# Patient Record
Sex: Male | Born: 1935 | Race: White | Hispanic: No | Smoking: Former smoker
Health system: Southern US, Community
[De-identification: ages and names within clinical notes are randomized; demographics above are authoritative.]

## PROBLEM LIST (undated history)

## (undated) DIAGNOSIS — K219 Gastro-esophageal reflux disease without esophagitis: Secondary | ICD-10-CM

## (undated) DIAGNOSIS — E785 Hyperlipidemia, unspecified: Secondary | ICD-10-CM

## (undated) DIAGNOSIS — R011 Cardiac murmur, unspecified: Secondary | ICD-10-CM

## (undated) DIAGNOSIS — Z889 Allergy status to unspecified drugs, medicaments and biological substances status: Secondary | ICD-10-CM

## (undated) DIAGNOSIS — F419 Anxiety disorder, unspecified: Secondary | ICD-10-CM

## (undated) DIAGNOSIS — R42 Dizziness and giddiness: Secondary | ICD-10-CM

## (undated) DIAGNOSIS — I8390 Asymptomatic varicose veins of unspecified lower extremity: Secondary | ICD-10-CM

## (undated) DIAGNOSIS — J45909 Unspecified asthma, uncomplicated: Secondary | ICD-10-CM

## (undated) DIAGNOSIS — I714 Abdominal aortic aneurysm, without rupture, unspecified: Secondary | ICD-10-CM

## (undated) DIAGNOSIS — R29898 Other symptoms and signs involving the musculoskeletal system: Secondary | ICD-10-CM

## (undated) DIAGNOSIS — M1990 Unspecified osteoarthritis, unspecified site: Secondary | ICD-10-CM

## (undated) DIAGNOSIS — R739 Hyperglycemia, unspecified: Secondary | ICD-10-CM

## (undated) DIAGNOSIS — L853 Xerosis cutis: Secondary | ICD-10-CM

## (undated) HISTORY — PX: NASAL SEPTUM SURGERY: SHX37

## (undated) HISTORY — DX: Other symptoms and signs involving the musculoskeletal system: R29.898

## (undated) HISTORY — DX: Hyperlipidemia, unspecified: E78.5

## (undated) HISTORY — DX: Hyperglycemia, unspecified: R73.9

## (undated) HISTORY — PX: CATARACT EXTRACTION W/ INTRAOCULAR LENS  IMPLANT, BILATERAL: SHX1307

## (undated) HISTORY — DX: Anxiety disorder, unspecified: F41.9

## (undated) HISTORY — PX: INGUINAL HERNIA REPAIR: SUR1180

## (undated) HISTORY — DX: Gastro-esophageal reflux disease without esophagitis: K21.9

## (undated) HISTORY — DX: Abdominal aortic aneurysm, without rupture, unspecified: I71.40

## (undated) HISTORY — DX: Asymptomatic varicose veins of unspecified lower extremity: I83.90

## (undated) HISTORY — PX: VARICOSE VEIN SURGERY: SHX832

## (undated) HISTORY — DX: Dizziness and giddiness: R42

## (undated) HISTORY — DX: Unspecified osteoarthritis, unspecified site: M19.90

## (undated) HISTORY — PX: COLONOSCOPY: SHX174

## (undated) HISTORY — DX: Abdominal aortic aneurysm, without rupture: I71.4

---

## 2013-02-20 DIAGNOSIS — R42 Dizziness and giddiness: Secondary | ICD-10-CM

## 2014-03-25 ENCOUNTER — Encounter: Payer: Self-pay | Admitting: Vascular Surgery

## 2014-04-14 ENCOUNTER — Encounter: Payer: Medicare Other | Admitting: Vascular Surgery

## 2015-09-29 ENCOUNTER — Encounter: Payer: Self-pay | Admitting: Vascular Surgery

## 2015-10-01 ENCOUNTER — Encounter: Payer: Self-pay | Admitting: Vascular Surgery

## 2015-10-06 ENCOUNTER — Ambulatory Visit (INDEPENDENT_AMBULATORY_CARE_PROVIDER_SITE_OTHER): Payer: Medicare Other | Admitting: Vascular Surgery

## 2015-10-06 ENCOUNTER — Telehealth: Payer: Self-pay | Admitting: Vascular Surgery

## 2015-10-06 ENCOUNTER — Other Ambulatory Visit: Payer: Self-pay | Admitting: Vascular Surgery

## 2015-10-06 ENCOUNTER — Encounter: Payer: Self-pay | Admitting: Vascular Surgery

## 2015-10-06 VITALS — BP 141/82 | HR 58 | Temp 97.1°F | Resp 16 | Ht 75.0 in | Wt 168.0 lb

## 2015-10-06 DIAGNOSIS — I714 Abdominal aortic aneurysm, without rupture, unspecified: Secondary | ICD-10-CM | POA: Insufficient documentation

## 2015-10-06 LAB — CREATININE, SERUM: Creat: 1.01 mg/dL (ref 0.70–1.11)

## 2015-10-06 NOTE — Progress Notes (Signed)
[facility] [place] HISTORY AND PHYSICAL   Referring Physician: Kirstie Peri, MD History of Present Illness:  Patient is a 82 y.o. male who presents for evaluation of  Abdominal aortic aneurysm. The patient has had a known aneurysm for 10-12 years. It has progressively slowly enlarged. Most recent ultrasound showed it was between 5 and 6 cm. The patient denies any abdominal or back pain. He has a family history of abdominal aortic aneurysm in his father who died during an operation for an aneurysm. His sister also had an abdominal aortic aneurysm but died from other causes. He is a former smoker but quit over 40 years ago.   The patient overall is very active. He walks 3 miles daily. He also does 30 minutes of weight training including body weight squats for 30 minutes 5 days per week. 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 Other medical problems include hyperlipidemia which is currently stable. He is not on aspirin due to previous history of nosebleeds with nasal polyps. He denies shortness of breath with exertion. He denies chest pain. He has no prior history of stroke or myocardial infarction.  Past Medical History  Diagnosis Date  . AAA (abdominal aortic aneurysm) (HCC)   . GERD (gastroesophageal reflux disease)   . Osteoarthritis   . Anxiety   . Varicose veins   . Hyperglycemia   . Hyperlipidemia   . Tinea corporis   . Vertigo     Past Surgical History  Procedure Laterality Date  . Varicose vein surgery    . Nasal sinus surgery    . Hernia repair Bilateral    left leg vein stripping ever weeks ago.  Social History Social History  Substance Use Topics  . Smoking status: Former Smoker    Types: Cigarettes    Quit date: 10/05/1968  . Smokeless tobacco: Never Used  . Alcohol Use: 0.0 oz/week    0 Standard drinks or equivalent per week    Family History Family History  Problem Relation Age of Onset  . AAA (abdominal aortic aneurysm) Father      Allergies  Allergies  Allergen Reactions  . Asa [Aspirin]     Avoids with hx of nasal polyps  . Coffee Flavor     Nocturnal jerking      Current Outpatient Prescriptions  Medication Sig Dispense Refill  . acetaminophen (TYLENOL) 650 MG CR tablet Take 650 mg by mouth every 8 (eight) hours as needed for pain.    . chlordiazePOXIDE (LIBRIUM) 5 MG capsule Take 5 mg by mouth at bedtime.    . clobetasol cream (TEMOVATE) 0.05 % Apply 1 application topically 2 (two) times daily.    . fluticasone (FLONASE) 50 MCG/ACT nasal spray Place 2 sprays into both nostrils daily.    . fluticasone (VERAMYST) 27.5 MCG/SPRAY nasal spray Place 2 sprays into the nose daily.    . metoprolol succinate (TOPROL-XL) 25 MG 24 hr tablet Take 25 mg by mouth daily.    Marland Kitchen omeprazole (PRILOSEC) 20 MG capsule Take 20 mg by mouth daily. Reported on 10/06/2015    . Probiotic Product (PROBIOTIC DAILY PO) Take 1 tablet by mouth daily. Reported on 10/06/2015     No current facility-administered medications for this visit.    ROS:   General:  No weight loss, Fever, chills  HEENT: No recent headaches, no nasal bleeding, no visual changes, no sore throat  Neurologic: No dizziness, blackouts, seizures. No recent symptoms of stroke or mini- stroke. No recent  episodes of slurred speech, or temporary blindness.  Cardiac: No recent episodes of chest pain/pressure, no shortness of breath at rest.  No shortness of breath with exertion.  Denies history of atrial fibrillation or irregular heartbeat  Vascular: No history of rest pain in feet.  No history of claudication.  No history of non-healing ulcer, No history of DVT   Pulmonary: No home oxygen, no productive cough, no hemoptysis,  No asthma or wheezing  Musculoskeletal:   Arthritis,  Low back pain,   Joint pain  Hematologic:No history of hypercoagulable state.  No history of easy bleeding.  No history of anemia  Gastrointestinal: No hematochezia or melena,  No  gastroesophageal reflux, no trouble swallowing  Urinary:  chronic Kidney disease,  on HD -  MWF or  TTHS,  Burning with urination,  Frequent urination,  Difficulty urinating;   Skin: No rashes  Psychological: No history of anxiety,  No history of depression   Physical Examination  Filed Vitals:   10/06/15 1317 10/06/15 1319 10/06/15 1326  BP: 160/88 160/90 141/82  Pulse: 58 58 58  Temp: 97.1 F (36.2 C)    Resp: 16    Height:  (1.905 m)    Weight: 168 lb (76.204 kg)    SpO2: 95%      Body mass index is 21 kg/(m^2).  General:  Alert and oriented, no acute distress HEENT: Normal Neck: No bruit or JVD Pulmonary: Clear to auscultation bilaterally Cardiac: Regular Rate and Rhythm without murmur Abdomen: Soft, non-tender, non-distended,  Pulsatile mass in the epigastrium nontender Skin: No rash Extremity Pulses:  2+ radial, brachial,  3+ right femoral widened pulsation consistent with right common femoral aneurysm,  2-3+ popliteal pulses bilaterally also widened,  2+dorsalis pedis, posterior tibial pulses bilaterally Musculoskeletal: No deformity or edema  Neurologic: Upper and lower extremity motor 5/5 and symmetric  DATA:   Ultrasound report dated 09/20/2015 from [facility] radiology is reviewed. This showed 6 x 5 cm abdominal aortic aneurysm with ectatic common iliac arteries.   ASSESSMENT:   Asymptomatic 6 cm abdominal aortic aneurysm. 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 He may also have popliteal aneurysms.   PLAN:   Due to the patient's family history of aneurysms and clinical exam consistent with peripheral aneurysms as well we will obtain a CT angiogram of the chest abdomen and pelvis to make sure that he does not have a thoracic aneurysm in addition to his abdominal aortic aneurysm. This will also allow planning for possible stent grafting of his abdominal aortic aneurysm. We will also schedule the  patient for cardiology evaluation for risk stratification. He'll return for follow-up with me after his cardiology evaluation and CT scan. If he is a candidate for repair he would prefer to have his aneurysm fixed between the week of March 13 the 17th while his daughter is off from work.  Fabienne Bruns, MD [facility] [place] Office: [phone] Pager: [phone]

## 2015-10-06 NOTE — Progress Notes (Signed)
Filed Vitals:   10/06/15 1317 10/06/15 1319 10/06/15 1326  BP: 160/88 160/90 141/82  Pulse: 58 58 58  Temp: 97.1 F (36.2 C)    Resp: 16    Height:  (1.905 m)    Weight: 168 lb (76.204 kg)    SpO2: 95%

## 2015-10-06 NOTE — Addendum Note (Signed)
Addended by: Adria Dill L on: 10/06/2015 02:38 PM   Modules accepted: Orders

## 2015-10-06 NOTE — Telephone Encounter (Signed)
Spoke with pt - gave him the following info which he wrote down - cardiology appointment with Dr. Darl Householder 10/11/15 8:45 am [phone]. CT's will be done at [facility] on 10/11/15 at 3:45pm. Patient already did his labs at [facility] today. He was told no solid foods 4 hrs prior. Then follow up with Dr. Darrick Penna on 10/28/15 at 3:30 pm. Patient verbalized understanding.

## 2015-10-08 ENCOUNTER — Telehealth: Payer: Self-pay | Admitting: *Deleted

## 2015-10-08 ENCOUNTER — Encounter: Payer: Self-pay | Admitting: *Deleted

## 2015-10-08 NOTE — Telephone Encounter (Signed)
This patient needs the CTA abdomen and pelvis with runoff because of popliteal fullness (R29.898) to rule out popliteal aneurysms. He has known thoracoabdominal aneurysm and common iliac aneurysms. These scans are for preop evaluation. Patient has strong family history of aneurysms.

## 2015-10-11 ENCOUNTER — Ambulatory Visit (HOSPITAL_COMMUNITY)
Admission: RE | Admit: 2015-10-11 | Discharge: 2015-10-11 | Disposition: A | Discharge status: Home / Self Care | Payer: Medicare Other | Source: Ambulatory Visit | Attending: Vascular Surgery | Admitting: Vascular Surgery

## 2015-10-11 ENCOUNTER — Ambulatory Visit (INDEPENDENT_AMBULATORY_CARE_PROVIDER_SITE_OTHER): Payer: Medicare Other | Admitting: Cardiovascular Disease

## 2015-10-11 ENCOUNTER — Encounter: Payer: Self-pay | Admitting: Cardiovascular Disease

## 2015-10-11 VITALS — BP 108/78 | HR 64 | Ht 75.0 in | Wt 169.0 lb

## 2015-10-11 DIAGNOSIS — I724 Aneurysm of artery of lower extremity: Secondary | ICD-10-CM | POA: Insufficient documentation

## 2015-10-11 DIAGNOSIS — I714 Abdominal aortic aneurysm, without rupture, unspecified: Secondary | ICD-10-CM

## 2015-10-11 DIAGNOSIS — I723 Aneurysm of iliac artery: Secondary | ICD-10-CM | POA: Insufficient documentation

## 2015-10-11 DIAGNOSIS — R918 Other nonspecific abnormal finding of lung field: Secondary | ICD-10-CM | POA: Insufficient documentation

## 2015-10-11 DIAGNOSIS — Z136 Encounter for screening for cardiovascular disorders: Secondary | ICD-10-CM

## 2015-10-11 DIAGNOSIS — R011 Cardiac murmur, unspecified: Secondary | ICD-10-CM

## 2015-10-11 DIAGNOSIS — Z01818 Encounter for other preprocedural examination: Secondary | ICD-10-CM | POA: Diagnosis not present

## 2015-10-11 MED ORDER — IOHEXOL 350 MG/ML SOLN
150.0000 mL | Freq: Once | INTRAVENOUS | Status: AC | PRN
  Administered 2015-10-11: 150 mL via INTRAVENOUS

## 2015-10-11 NOTE — Patient Instructions (Signed)
Your physician has requested that you have an echocardiogram. Echocardiography is a painless test that uses sound waves to create images of your heart. It provides your doctor with information about the size and shape of your heart and how well your heart's chambers and valves are working. This procedure takes approximately one hour. There are no restrictions for this procedure. Office will contact with results via phone or letter.   Continue all current medications. Follow up based on test results  

## 2015-10-11 NOTE — Progress Notes (Signed)
Patient ID: Devin Shaw, male   DOB: Nov 30, 1923, 82 y.o.   MRN: [id number]       CARDIOLOGY CONSULT NOTE  Patient ID: Devin Shaw MRN: [id number] DOB/AGE: 10/12/1923 82 y.o.  Admit date: (Not on file) Primary Physician Kirstie Peri, MD  Reason for Consultation: AAA, pre-op clearance  HPI: 82 yr old male presents for preoperative risk stratification. Has AAA and evaluated by Dr. Darrick Penna on 10/06/15 who plans to perform stent grafting in March. Quit smoking over 40 yrs ago. Denies chest pain and exertional dyspnea. Stays active and walks 3 miles daily and does strength/weight training with barbells 30 minutes five days per week. Denies a h/o MI and CVA.  Has some issues with dizziness due to "inner ear problems" and occasionally balance, but denies syncope.  ECG today shows sinus rhythm with LVH and nonspecific T wave abnormalities.    Allergies  Allergen Reactions  . Asa [Aspirin]     Avoids with hx of nasal polyps  . Coffee Flavor     Nocturnal jerking     Current Outpatient Prescriptions  Medication Sig Dispense Refill  . acetaminophen (TYLENOL) 650 MG CR tablet Take 650 mg by mouth every 8 (eight) hours as needed for pain.    . cetirizine (ZYRTEC) 10 MG tablet Take 10 mg by mouth daily.    . chlordiazePOXIDE (LIBRIUM) 5 MG capsule Take 5 mg by mouth at bedtime.    . fluticasone (FLONASE) 50 MCG/ACT nasal spray Place 2 sprays into both nostrils daily.    . metoprolol succinate (TOPROL-XL) 25 MG 24 hr tablet Take 12.5 mg by mouth daily.      No current facility-administered medications for this visit.    Past Medical History  Diagnosis Date  . AAA (abdominal aortic aneurysm) (HCC)   . GERD (gastroesophageal reflux disease)   . Osteoarthritis   . Anxiety   . Varicose veins   . Hyperglycemia   . Hyperlipidemia   . Tinea corporis   . Vertigo   . Popliteal fullness     Past Surgical History  Procedure Laterality Date  . Varicose vein surgery    . Nasal sinus surgery     . Hernia repair Bilateral     Social History   Social History  . Marital Status: Married    Spouse Name: N/A  . Number of Children: N/A  . Years of Education: N/A   Occupational History  . Not on file.   Social History Main Topics  . Smoking status: Former Smoker    Types: Cigarettes    Quit date: 10/05/1968  . Smokeless tobacco: Never Used  . Alcohol Use: 0.0 oz/week    0 Standard drinks or equivalent per week  . Drug Use: No  . Sexual Activity: Not on file   Other Topics Concern  . Not on file   Social History Narrative     No family history of premature CAD in 1st degree relatives.  Prior to Admission medications   Medication Sig Start Date End Date Taking? Authorizing Provider  acetaminophen (TYLENOL) 650 MG CR tablet Take 650 mg by mouth every 8 (eight) hours as needed for pain.   Yes Historical Provider, MD  cetirizine (ZYRTEC) 10 MG tablet Take 10 mg by mouth daily.   Yes Historical Provider, MD  chlordiazePOXIDE (LIBRIUM) 5 MG capsule Take 5 mg by mouth at bedtime.   Yes Historical Provider, MD  fluticasone (FLONASE) 50 MCG/ACT nasal spray Place 2 sprays into both nostrils  daily.   Yes Historical Provider, MD  metoprolol succinate (TOPROL-XL) 25 MG 24 hr tablet Take 12.5 mg by mouth daily.    Yes Historical Provider, MD     Review of systems complete and found to be negative unless listed above in HPI     Physical exam Blood pressure 108/78, pulse 64, height  (1.905 m), weight 169 lb (76.658 kg), SpO2 98 %. General: NAD Neck: No JVD, no thyromegaly or thyroid nodule.  Lungs: Clear to auscultation bilaterally with normal respiratory effort. CV: Nondisplaced PMI. Regular rate and rhythm, normal S1/S2, no S3/S4, 2/6 apical holosystolic murmur.  No peripheral edema.  No carotid bruit.   Abdomen: Soft, nontender no distention.  Skin: Intact without lesions or rashes.  Neurologic: Alert and oriented x 3.  Psych: Normal affect. Extremities: No  clubbing or cyanosis.  HEENT: Normal.   ECG: Most recent ECG reviewed.  Labs:  No results found for: WBC, HGB, HCT, MCV, PLT  Recent Labs Lab 10/06/15 1520  CREATININE 1.01   No results found for: CKTOTAL, CKMB, CKMBINDEX, TROPONINI No results found for: CHOL No results found for: HDL No results found for: LDLCALC No results found for: TRIG No results found for: CHOLHDL No results found for: LDLDIRECT       Studies: No results found.  ASSESSMENT AND PLAN:  1. Preoperative risk stratification: He has no h/o MI or CVA. He is able to walk a 20 minute mile which is roughly 3.5 METS. I feel he is at an acceptable risk for AAA stent grafting and does not warrant stress testing. He has an apical murmur for which I will obtain an echocardiogram, and to assess cardiac structure and function. PCP initiated Toprol-XL 12.5 mg daily roughly one week ago, which he can continue throughout the perioperative period to attenuate risk of major adverse cardiac events.  2. Murmur: He has an apical murmur for which I will obtain an echocardiogram.  Dispo: f/u to be determined.   Signed: Prentice Docker, M.D., F.A.C.C.  10/11/2015, 8:45 AM

## 2015-10-14 ENCOUNTER — Encounter: Payer: Self-pay | Admitting: Vascular Surgery

## 2015-10-14 ENCOUNTER — Other Ambulatory Visit: Payer: Self-pay

## 2015-10-14 ENCOUNTER — Ambulatory Visit (INDEPENDENT_AMBULATORY_CARE_PROVIDER_SITE_OTHER): Payer: Medicare Other

## 2015-10-14 ENCOUNTER — Ambulatory Visit (INDEPENDENT_AMBULATORY_CARE_PROVIDER_SITE_OTHER): Payer: Medicare Other | Admitting: Vascular Surgery

## 2015-10-14 ENCOUNTER — Other Ambulatory Visit: Payer: Medicare Other

## 2015-10-14 VITALS — BP 145/85 | HR 67 | Temp 97.4°F | Resp 16 | Ht 75.0 in | Wt 167.0 lb

## 2015-10-14 DIAGNOSIS — R011 Cardiac murmur, unspecified: Secondary | ICD-10-CM | POA: Diagnosis not present

## 2015-10-14 DIAGNOSIS — I714 Abdominal aortic aneurysm, without rupture, unspecified: Secondary | ICD-10-CM

## 2015-10-14 DIAGNOSIS — Z01818 Encounter for other preprocedural examination: Secondary | ICD-10-CM

## 2015-10-14 DIAGNOSIS — I724 Aneurysm of artery of lower extremity: Secondary | ICD-10-CM | POA: Diagnosis not present

## 2015-10-14 NOTE — Progress Notes (Signed)
Filed Vitals:   10/14/15 1242 10/14/15 1246  BP: 141/80 145/85  Pulse: 65 67  Temp: 97.4 F (36.3 C)   TempSrc: Oral   Resp: 16   Height:  (1.905 m)   Weight: 167 lb (75.751 kg)   SpO2: 96%

## 2015-10-14 NOTE — Progress Notes (Signed)
[facility]  Patient name: Devin Shaw MRN: [id number] DOB: 1924-01-23 Sex: male  REASON FOR VISIT: Follow-up aneurysms  HPI: Devin Shaw is a 82 y.o. male seen last week for abdominal aortic aneurysm. He returns today after recent CT scan. He continues to deny any abdominal or back pain.  Past Medical History  Diagnosis Date  . AAA (abdominal aortic aneurysm) (HCC)   . GERD (gastroesophageal reflux disease)   . Osteoarthritis   . Anxiety   . Varicose veins   . Hyperglycemia   . Hyperlipidemia   . Tinea corporis   . Vertigo   . Popliteal fullness     Family History  Problem Relation Age of Onset  . AAA (abdominal aortic aneurysm) Father     SOCIAL HISTORY: Social History  Substance Use Topics  . Smoking status: Former Smoker    Types: Cigarettes    Quit date: 10/05/1968  . Smokeless tobacco: Never Used  . Alcohol Use: 0.0 oz/week    0 Standard drinks or equivalent per week    Allergies  Allergen Reactions  . Asa [Aspirin]     Avoids with hx of nasal polyps  . Coffee Flavor     Nocturnal jerking     Current Outpatient Prescriptions  Medication Sig Dispense Refill  . acetaminophen (TYLENOL) 650 MG CR tablet Take 650 mg by mouth every 8 (eight) hours as needed for pain.    . cetirizine (ZYRTEC) 10 MG tablet Take 10 mg by mouth daily.    . chlordiazePOXIDE (LIBRIUM) 5 MG capsule Take 5 mg by mouth at bedtime.    . fluticasone (FLONASE) 50 MCG/ACT nasal spray Place 2 sprays into both nostrils daily.    . metoprolol succinate (TOPROL-XL) 25 MG 24 hr tablet Take 12.5 mg by mouth daily.      No current facility-administered medications for this visit.    REVIEW OF SYSTEMS:   denotes positive finding,  denotes negative finding Cardiac  Comments:  Chest pain or chest pressure:    Shortness of breath upon exertion:    Short of breath when lying flat:    Irregular heart rhythm:        Vascular    Pain in calf, thigh, or  hip brought on by ambulation:    Pain in feet at night that wakes you up from your sleep:     Blood clot in your veins:    Leg swelling:         Pulmonary    Oxygen at home:    Productive cough:     Wheezing:         Neurologic    Sudden weakness in arms or legs:     Sudden numbness in arms or legs:     Sudden onset of difficulty speaking or slurred speech:    Temporary loss of vision in one eye:     Problems with dizziness:         Gastrointestinal    Blood in stool:     Vomited blood:         Genitourinary    Burning when urinating:     Blood in urine:        Psychiatric    Major depression:         Hematologic    Bleeding problems:    Problems with blood clotting too easily:        Skin    Rashes or ulcers:  Constitutional    Fever or chills:      PHYSICAL EXAM: There were no vitals filed for this visit.  GENERAL: The patient is a well-nourished male, in no acute distress. The vital signs are documented above. CARDIAC: There is a regular rate and rhythm.  VASCULAR: 3+ femoral popliteal pulses bilaterally palpable pulsatile mass in the epigastrium nontender PULMONARY: There is good air exchange bilaterally without wheezing or rales. ABDOMEN: Soft and non-tender with normal pitched bowel sounds.  MUSCULOSKELETAL: There are no major deformities or cyanosis. NEUROLOGIC: No focal weakness or paresthesias are detected. SKIN: There are no ulcers or rashes noted. PSYCHIATRIC: The patient has a normal affect.  DATA:  I reviewed the patient's CT images today. This shows a 3.5 cm thoracic aorta, 6 cm infrarenal abdominal aortic aneurysm, there appears to be about a 22 mm neck although there is some angulation  (50). Both common iliac arteries are ectatic at 2 cm. The left internal is 2.5 cm the right internal is 2.4 cm both of these were aneurysmal. The left common femoral artery is aneurysmal at 2.2 cm the right common femoral is aneurysmal at 3.9 cm. He also had  a 3.1 cm left popliteal aneurysm as well as a 1.8 cm right popliteal aneurysm both of these were lined with thrombus. Of note he also had a lung nodule that will probably need repeat follow-up with a CT scan of the chest in 6 months.  MEDICAL ISSUES: Patient with bilateral common femoral artery aneurysms. The right one is fairly large. The patient also has a 6 cm infrarenal abdominal aortic aneurysm. Although there is some tortuosity and neck angulation I believe this would be fixable with a Gore Excluder stent graft. I would plan on fixing the right common femoral aneurysm at the same time. We would then plan interval repair of his left common femoral and left popliteal aneurysms. Since the right popliteal aneurysm is less than 2 cm in diameter it could be observed for now. This is also true for both internal iliac artery aneurysms.  Tentatively we will plan for his aneurysm repair 11/01/2015. This is pending final measurements of his CT for stent graft repair as well as his cardiology evaluation.  Fabienne Bruns [facility] [employer] [facility] [employer]: [phone]

## 2015-10-15 ENCOUNTER — Other Ambulatory Visit: Payer: Self-pay

## 2015-10-27 ENCOUNTER — Encounter (HOSPITAL_COMMUNITY)
Admission: RE | Admit: 2015-10-27 | Discharge: 2015-10-27 | Disposition: A | Discharge status: Home / Self Care | Payer: Medicare Other | Source: Ambulatory Visit | Attending: Vascular Surgery | Admitting: Vascular Surgery

## 2015-10-27 ENCOUNTER — Encounter (HOSPITAL_COMMUNITY): Payer: Self-pay

## 2015-10-27 DIAGNOSIS — I724 Aneurysm of artery of lower extremity: Secondary | ICD-10-CM | POA: Insufficient documentation

## 2015-10-27 DIAGNOSIS — Z01818 Encounter for other preprocedural examination: Secondary | ICD-10-CM | POA: Insufficient documentation

## 2015-10-27 DIAGNOSIS — I714 Abdominal aortic aneurysm, without rupture: Secondary | ICD-10-CM | POA: Insufficient documentation

## 2015-10-27 DIAGNOSIS — Z01812 Encounter for preprocedural laboratory examination: Secondary | ICD-10-CM | POA: Diagnosis not present

## 2015-10-27 DIAGNOSIS — Z0183 Encounter for blood typing: Secondary | ICD-10-CM | POA: Diagnosis not present

## 2015-10-27 HISTORY — DX: Xerosis cutis: L85.3

## 2015-10-27 HISTORY — DX: Allergy status to unspecified drugs, medicaments and biological substances: Z88.9

## 2015-10-27 LAB — BLOOD GAS, ARTERIAL
Acid-Base Excess: 3.7 mmol/L — ABNORMAL HIGH (ref 0.0–2.0)
BICARBONATE: 27.3 meq/L — AB (ref 20.0–24.0)
Drawn by: 42180
FIO2: 0.21
O2 Saturation: 96.5 %
PCO2 ART: 37.7 mmHg (ref 35.0–45.0)
PO2 ART: 80 mmHg (ref 80.0–100.0)
Patient temperature: 98.6
TCO2: 28.4 mmol/L (ref 0–100)
pH, Arterial: 7.473 — ABNORMAL HIGH (ref 7.350–7.450)

## 2015-10-27 LAB — URINALYSIS, ROUTINE W REFLEX MICROSCOPIC
Bilirubin Urine: NEGATIVE
GLUCOSE, UA: NEGATIVE mg/dL
Ketones, ur: NEGATIVE mg/dL
LEUKOCYTES UA: NEGATIVE
Nitrite: NEGATIVE
PROTEIN: NEGATIVE mg/dL
SPECIFIC GRAVITY, URINE: 1.015 (ref 1.005–1.030)
pH: 7.5 (ref 5.0–8.0)

## 2015-10-27 LAB — URINE MICROSCOPIC-ADD ON

## 2015-10-27 LAB — TYPE AND SCREEN
ABO/RH(D): A POS
Antibody Screen: NEGATIVE

## 2015-10-27 LAB — APTT: aPTT: 31 seconds (ref 24–37)

## 2015-10-27 LAB — PROTIME-INR
INR: 1.05 (ref 0.00–1.49)
PROTHROMBIN TIME: 13.9 s (ref 11.6–15.2)

## 2015-10-27 LAB — COMPREHENSIVE METABOLIC PANEL
ALK PHOS: 48 U/L (ref 38–126)
ALT: 15 U/L — AB (ref 17–63)
ANION GAP: 12 (ref 5–15)
AST: 21 U/L (ref 15–41)
Albumin: 4 g/dL (ref 3.5–5.0)
BILIRUBIN TOTAL: 1 mg/dL (ref 0.3–1.2)
BUN: 19 mg/dL (ref 6–20)
CALCIUM: 9.4 mg/dL (ref 8.9–10.3)
CO2: 24 mmol/L (ref 22–32)
CREATININE: 0.85 mg/dL (ref 0.61–1.24)
Chloride: 105 mmol/L (ref 101–111)
Glucose, Bld: 110 mg/dL — ABNORMAL HIGH (ref 65–99)
Potassium: 4.1 mmol/L (ref 3.5–5.1)
Sodium: 141 mmol/L (ref 135–145)
TOTAL PROTEIN: 7.1 g/dL (ref 6.5–8.1)

## 2015-10-27 LAB — CBC
HEMATOCRIT: 43 % (ref 39.0–52.0)
HEMOGLOBIN: 14.1 g/dL (ref 13.0–17.0)
MCH: 31.1 pg (ref 26.0–34.0)
MCHC: 32.8 g/dL (ref 30.0–36.0)
MCV: 94.7 fL (ref 78.0–100.0)
Platelets: 133 10*3/uL — ABNORMAL LOW (ref 150–400)
RBC: 4.54 MIL/uL (ref 4.22–5.81)
RDW: 13.7 % (ref 11.5–15.5)
WBC: 4.5 10*3/uL (ref 4.0–10.5)

## 2015-10-27 LAB — ABO/RH: ABO/RH(D): A POS

## 2015-10-27 LAB — SURGICAL PCR SCREEN
MRSA, PCR: NEGATIVE
Staphylococcus aureus: POSITIVE — AB

## 2015-10-27 NOTE — Pre-Procedure Instructions (Signed)
OLUWATOMIWA KINYON  10/27/2015      [facility] - [place], [place] - [address] [place] [address] Phone: [phone] Fax: [phone]    Your procedure is scheduled on March 6th, Monday   Report to [facility] Admitting at 5:30 am             (Posted surgery time 7:30 am - 9:59 am)   Call this number if you have problems the morning of surgery:  [phone]   Remember:  Do not eat food or drink liquids after midnight Sunday.   Take these medicines the morning of surgery with A SIP OF WATER : Toprol.  Please use Flonase   Do not wear jewelry - no rings or watches.  Do not wear lotions or colognes.   You may NOT wear deodorant the morning of surgery.              Men may shave face and neck.  Do not bring valuables to the hospital.  [facility] is not responsible for any belongings or valuables.  Contacts, dentures or bridgework may not be worn into surgery.  Leave your suitcase in the car.  After surgery it may be brought to your room.  For patients admitted to the hospital, discharge time will be determined by your treatment team.  Name and phone number of your driver:     Please read over the following fact sheets that you were given. Pain Booklet, Coughing and Deep Breathing, Blood Transfusion Information, MRSA Information and Surgical Site Infection Prevention

## 2015-10-27 NOTE — Progress Notes (Signed)
Cardiac clearance note inside chart from [facility] in [place] Has narrowed nasal passages.

## 2015-10-28 ENCOUNTER — Ambulatory Visit: Payer: Medicare Other | Admitting: Vascular Surgery

## 2015-10-31 MED ORDER — SODIUM CHLORIDE 0.9 % IV SOLN: INTRAVENOUS | Status: DC

## 2015-10-31 MED ORDER — CHLORHEXIDINE GLUCONATE CLOTH 2 % EX PADS: 6.0000 | MEDICATED_PAD | Freq: Once | CUTANEOUS | Status: DC

## 2015-10-31 MED ORDER — DEXTROSE 5 % IV SOLN
1.5000 g | INTRAVENOUS | Status: AC
  Administered 2015-11-01: 1.5 g via INTRAVENOUS
  Filled 2015-10-31: qty 1.5

## 2015-10-31 NOTE — Anesthesia Preprocedure Evaluation (Addendum)
Anesthesia Evaluation  Patient identified by MRN, date of birth, ID band Patient awake    Reviewed: Allergy & Precautions, NPO status , Patient's Chart, lab work & pertinent test results, reviewed documented beta blocker date and time   Airway Mallampati: II  TM Distance: >3 FB Neck ROM: Full    Dental  (+) Teeth Intact   Pulmonary former smoker,    breath sounds clear to auscultation       Cardiovascular + Peripheral Vascular Disease   Rhythm:Regular Rate:Normal     Neuro/Psych PSYCHIATRIC DISORDERS Anxiety negative neurological ROS     GI/Hepatic Neg liver ROS, GERD  ,  Endo/Other  negative endocrine ROS  Renal/GU negative Renal ROS  negative genitourinary   Musculoskeletal  (+) Arthritis ,   Abdominal   Peds negative pediatric ROS (+)  Hematology negative hematology ROS (+)   Anesthesia Other Findings   Reproductive/Obstetrics negative OB ROS                            Lab Results  Component Value Date   WBC 4.5 10/27/2015   HGB 14.1 10/27/2015   HCT 43.0 10/27/2015   MCV 94.7 10/27/2015   PLT 133* 10/27/2015   Lab Results  Component Value Date   INR 1.05 10/27/2015   Lab Results  Component Value Date   CREATININE 0.85 10/27/2015   BUN 19 10/27/2015   NA 141 10/27/2015   K 4.1 10/27/2015   CL 105 10/27/2015   CO2 24 10/27/2015   09/2015 EKG: NSR  09/2015 Echo - Left ventricle: The cavity size was normal. Wall thickness was increased in a pattern of mild LVH. Systolic function was normal. The estimated ejection fraction was in the range of 50% to 55%. Possible hypokinesis of the basalinferolateral myocardium. Doppler parameters are consistent with abnormal left ventricular relaxation (grade 1 diastolic dysfunction). - Aortic valve: Trileaflet; mildly to moderately calcified leaflets. There was trivial regurgitation. - Mitral valve: Calcified annulus. There was trivial  regurgitation. - Left atrium: The atrium was at the upper limits of normal in size. - Right atrium: The atrium was mildly dilated. - Tricuspid valve: There was trivial regurgitation. - Pulmonary arteries: Systolic pressure could not be accurately estimated. - Pericardium, extracardiac: There was no pericardial effusion.   Anesthesia Physical Anesthesia Plan  ASA: III  Anesthesia Plan: General   Post-op Pain Management:    Induction: Intravenous  Airway Management Planned: Oral ETT  Additional Equipment: Arterial line  Intra-op Plan:   Post-operative Plan: Possible Post-op intubation/ventilation  Informed Consent: I have reviewed the patients History and Physical, chart, labs and discussed the procedure including the risks, benefits and alternatives for the proposed anesthesia with the patient or authorized representative who has indicated his/her understanding and acceptance.   Dental advisory given  Plan Discussed with: CRNA  Anesthesia Plan Comments:         Anesthesia Quick Evaluation

## 2015-11-01 ENCOUNTER — Inpatient Hospital Stay (HOSPITAL_COMMUNITY): Payer: Medicare Other | Admitting: Anesthesiology

## 2015-11-01 ENCOUNTER — Encounter (HOSPITAL_COMMUNITY): Payer: Self-pay | Admitting: *Deleted

## 2015-11-01 ENCOUNTER — Encounter (HOSPITAL_COMMUNITY)
Admission: RE | Disposition: A | Discharge status: Home Health | Payer: Self-pay | Source: Ambulatory Visit | Attending: Vascular Surgery

## 2015-11-01 ENCOUNTER — Inpatient Hospital Stay (HOSPITAL_COMMUNITY)
Admission: RE | Admit: 2015-11-01 | Discharge: 2015-11-03 | DRG: 269 | Disposition: A | Discharge status: Home Health | Payer: Medicare Other | Source: Ambulatory Visit | Attending: Vascular Surgery | Admitting: Vascular Surgery

## 2015-11-01 ENCOUNTER — Inpatient Hospital Stay (HOSPITAL_COMMUNITY): Payer: Medicare Other

## 2015-11-01 DIAGNOSIS — I714 Abdominal aortic aneurysm, without rupture, unspecified: Secondary | ICD-10-CM | POA: Diagnosis present

## 2015-11-01 DIAGNOSIS — I739 Peripheral vascular disease, unspecified: Secondary | ICD-10-CM | POA: Diagnosis present

## 2015-11-01 DIAGNOSIS — S75091A Other specified injury of femoral artery, right leg, initial encounter: Secondary | ICD-10-CM | POA: Diagnosis not present

## 2015-11-01 DIAGNOSIS — Z9889 Other specified postprocedural states: Secondary | ICD-10-CM

## 2015-11-01 DIAGNOSIS — I724 Aneurysm of artery of lower extremity: Secondary | ICD-10-CM | POA: Diagnosis present

## 2015-11-01 DIAGNOSIS — E785 Hyperlipidemia, unspecified: Secondary | ICD-10-CM | POA: Diagnosis present

## 2015-11-01 DIAGNOSIS — K219 Gastro-esophageal reflux disease without esophagitis: Secondary | ICD-10-CM | POA: Diagnosis present

## 2015-11-01 DIAGNOSIS — M1990 Unspecified osteoarthritis, unspecified site: Secondary | ICD-10-CM | POA: Diagnosis present

## 2015-11-01 DIAGNOSIS — F419 Anxiety disorder, unspecified: Secondary | ICD-10-CM | POA: Diagnosis present

## 2015-11-01 DIAGNOSIS — Z79899 Other long term (current) drug therapy: Secondary | ICD-10-CM

## 2015-11-01 DIAGNOSIS — D62 Acute posthemorrhagic anemia: Secondary | ICD-10-CM | POA: Diagnosis not present

## 2015-11-01 DIAGNOSIS — Z87891 Personal history of nicotine dependence: Secondary | ICD-10-CM

## 2015-11-01 HISTORY — PX: ABDOMINAL AORTIC ENDOVASCULAR STENT GRAFT: SHX5707

## 2015-11-01 LAB — CBC
HCT: 38.8 % — ABNORMAL LOW (ref 39.0–52.0)
HEMATOCRIT: 38.8 % — AB (ref 39.0–52.0)
HEMOGLOBIN: 12.8 g/dL — AB (ref 13.0–17.0)
Hemoglobin: 13.3 g/dL (ref 13.0–17.0)
MCH: 31.2 pg (ref 26.0–34.0)
MCH: 32.8 pg (ref 26.0–34.0)
MCHC: 33 g/dL (ref 30.0–36.0)
MCHC: 34.3 g/dL (ref 30.0–36.0)
MCV: 94.6 fL (ref 78.0–100.0)
MCV: 95.6 fL (ref 78.0–100.0)
PLATELETS: 100 10*3/uL — AB (ref 150–400)
Platelets: 100 10*3/uL — ABNORMAL LOW (ref 150–400)
RBC: 4.1 MIL/uL — ABNORMAL LOW (ref 4.22–5.81)
RBC: 4.06 MIL/uL — AB (ref 4.22–5.81)
RDW: 13.8 % (ref 11.5–15.5)
RDW: 13.9 % (ref 11.5–15.5)
WBC: 5.6 10*3/uL (ref 4.0–10.5)
WBC: 8.2 10*3/uL (ref 4.0–10.5)

## 2015-11-01 LAB — BASIC METABOLIC PANEL
Anion gap: 7 (ref 5–15)
BUN: 15 mg/dL (ref 6–20)
CALCIUM: 8.3 mg/dL — AB (ref 8.9–10.3)
CHLORIDE: 106 mmol/L (ref 101–111)
CO2: 28 mmol/L (ref 22–32)
CREATININE: 0.86 mg/dL (ref 0.61–1.24)
GFR calc non Af Amer: >60 mL/min (ref >60)
Glucose, Bld: 158 mg/dL — ABNORMAL HIGH (ref 65–99)
Potassium: 3.9 mmol/L (ref 3.5–5.1)
SODIUM: 141 mmol/L (ref 135–145)

## 2015-11-01 LAB — MAGNESIUM: Magnesium: 1.9 mg/dL (ref 1.7–2.4)

## 2015-11-01 LAB — CREATININE, SERUM
Creatinine, Ser: 0.9 mg/dL (ref 0.61–1.24)
GFR calc Af Amer: >60 mL/min (ref >60)
GFR calc non Af Amer: >60 mL/min (ref >60)

## 2015-11-01 LAB — PROTIME-INR
INR: 1.17 (ref 0.00–1.49)
PROTHROMBIN TIME: 15 s (ref 11.6–15.2)

## 2015-11-01 LAB — APTT: aPTT: 36 seconds (ref 24–37)

## 2015-11-01 SURGERY — INSERTION, ENDOVASCULAR STENT GRAFT, AORTA, ABDOMINAL
Anesthesia: General | Site: Abdomen

## 2015-11-01 MED ORDER — METOPROLOL TARTRATE 12.5 MG HALF TABLET
ORAL_TABLET | ORAL | Status: AC
Start: 2015-11-01 — End: 2015-11-01
  Filled 2015-11-01: qty 1

## 2015-11-01 MED ORDER — PROPOFOL 10 MG/ML IV BOLUS
INTRAVENOUS | Status: DC | PRN
  Administered 2015-11-01: 150 mg via INTRAVENOUS

## 2015-11-01 MED ORDER — MEPERIDINE HCL 25 MG/ML IJ SOLN: 6.2500 mg | INTRAMUSCULAR | Status: DC | PRN

## 2015-11-01 MED ORDER — FENTANYL CITRATE (PF) 100 MCG/2ML IJ SOLN
INTRAMUSCULAR | Status: DC | PRN
  Administered 2015-11-01: 50 ug via INTRAVENOUS
  Administered 2015-11-01: 150 ug via INTRAVENOUS

## 2015-11-01 MED ORDER — PHENYLEPHRINE 40 MCG/ML (10ML) SYRINGE FOR IV PUSH (FOR BLOOD PRESSURE SUPPORT)
PREFILLED_SYRINGE | INTRAVENOUS | Status: AC
  Filled 2015-11-01: qty 10

## 2015-11-01 MED ORDER — HYDRALAZINE HCL 20 MG/ML IJ SOLN: 5.0000 mg | INTRAMUSCULAR | Status: DC | PRN

## 2015-11-01 MED ORDER — LABETALOL HCL 5 MG/ML IV SOLN: 10.0000 mg | INTRAVENOUS | Status: DC | PRN

## 2015-11-01 MED ORDER — ACETAMINOPHEN 650 MG RE SUPP: 325.0000 mg | RECTAL | Status: DC | PRN

## 2015-11-01 MED ORDER — LIDOCAINE HCL (CARDIAC) 20 MG/ML IV SOLN
INTRAVENOUS | Status: DC | PRN
  Administered 2015-11-01: 60 mg via INTRAVENOUS

## 2015-11-01 MED ORDER — MAGNESIUM SULFATE 2 GM/50ML IV SOLN
2.0000 g | Freq: Every day | INTRAVENOUS | Status: DC | PRN
  Filled 2015-11-01: qty 50

## 2015-11-01 MED ORDER — ALUM & MAG HYDROXIDE-SIMETH 200-200-20 MG/5ML PO SUSP: 15.0000 mL | ORAL | Status: DC | PRN

## 2015-11-01 MED ORDER — METOPROLOL TARTRATE 12.5 MG HALF TABLET
12.5000 mg | ORAL_TABLET | Freq: Once | ORAL | Status: AC
  Administered 2015-11-01: 12.5 mg via ORAL
  Filled 2015-11-01: qty 1

## 2015-11-01 MED ORDER — STERILE WATER FOR INJECTION IJ SOLN
INTRAMUSCULAR | Status: AC
  Filled 2015-11-01: qty 10

## 2015-11-01 MED ORDER — DOCUSATE SODIUM 100 MG PO CAPS
100.0000 mg | ORAL_CAPSULE | Freq: Every day | ORAL | Status: DC
  Administered 2015-11-02 – 2015-11-03 (×2): 100 mg via ORAL
  Filled 2015-11-01 (×2): qty 1

## 2015-11-01 MED ORDER — ROCURONIUM BROMIDE 50 MG/5ML IV SOLN
INTRAVENOUS | Status: AC
  Filled 2015-11-01: qty 1

## 2015-11-01 MED ORDER — MORPHINE SULFATE (PF) 2 MG/ML IV SOLN: 2.0000 mg | INTRAVENOUS | Status: DC | PRN

## 2015-11-01 MED ORDER — SODIUM CHLORIDE 0.9 % IV SOLN
INTRAVENOUS | Status: DC
  Administered 2015-11-01: 21:00:00 via INTRAVENOUS

## 2015-11-01 MED ORDER — PROTAMINE SULFATE 10 MG/ML IV SOLN
INTRAVENOUS | Status: AC
  Filled 2015-11-01: qty 10

## 2015-11-01 MED ORDER — GLYCOPYRROLATE 0.2 MG/ML IJ SOLN
INTRAMUSCULAR | Status: DC | PRN
  Administered 2015-11-01 (×2): 0.1 mg via INTRAVENOUS

## 2015-11-01 MED ORDER — PROPOFOL 10 MG/ML IV BOLUS
INTRAVENOUS | Status: AC
  Filled 2015-11-01: qty 20

## 2015-11-01 MED ORDER — BISACODYL 10 MG RE SUPP: 10.0000 mg | Freq: Every day | RECTAL | Status: DC | PRN

## 2015-11-01 MED ORDER — 0.9 % SODIUM CHLORIDE (POUR BTL) OPTIME
TOPICAL | Status: DC | PRN
  Administered 2015-11-01: 1000 mL

## 2015-11-01 MED ORDER — PANTOPRAZOLE SODIUM 40 MG PO TBEC
40.0000 mg | DELAYED_RELEASE_TABLET | Freq: Every day | ORAL | Status: DC
  Administered 2015-11-02 – 2015-11-03 (×2): 40 mg via ORAL
  Filled 2015-11-01 (×2): qty 1

## 2015-11-01 MED ORDER — IODIXANOL 320 MG/ML IV SOLN
INTRAVENOUS | Status: DC | PRN
  Administered 2015-11-01: 35 mL via INTRAVENOUS
  Administered 2015-11-01: 70.9 mL via INTRAVENOUS

## 2015-11-01 MED ORDER — ENOXAPARIN SODIUM 40 MG/0.4ML ~~LOC~~ SOLN
40.0000 mg | SUBCUTANEOUS | Status: DC
  Administered 2015-11-02: 40 mg via SUBCUTANEOUS
  Filled 2015-11-01: qty 0.4

## 2015-11-01 MED ORDER — PROTAMINE SULFATE 10 MG/ML IV SOLN
INTRAVENOUS | Status: DC | PRN
  Administered 2015-11-01: 70 mg via INTRAVENOUS

## 2015-11-01 MED ORDER — MUPIROCIN 2 % EX OINT
TOPICAL_OINTMENT | CUTANEOUS | Status: AC
  Administered 2015-11-01: 1
  Filled 2015-11-01: qty 22

## 2015-11-01 MED ORDER — METOPROLOL SUCCINATE ER 25 MG PO TB24
12.5000 mg | ORAL_TABLET | Freq: Every day | ORAL | Status: DC
  Administered 2015-11-02: 12.5 mg via ORAL
  Filled 2015-11-01: qty 1

## 2015-11-01 MED ORDER — SODIUM CHLORIDE 0.9 % IV SOLN: 500.0000 mL | Freq: Once | INTRAVENOUS | Status: DC | PRN

## 2015-11-01 MED ORDER — CHLORDIAZEPOXIDE HCL 5 MG PO CAPS
5.0000 mg | ORAL_CAPSULE | Freq: Every evening | ORAL | Status: DC | PRN
  Administered 2015-11-02: 5 mg via ORAL
  Filled 2015-11-01 (×2): qty 1

## 2015-11-01 MED ORDER — SUCCINYLCHOLINE CHLORIDE 20 MG/ML IJ SOLN
INTRAMUSCULAR | Status: AC
  Filled 2015-11-01: qty 1

## 2015-11-01 MED ORDER — FENTANYL CITRATE (PF) 100 MCG/2ML IJ SOLN
25.0000 ug | INTRAMUSCULAR | Status: DC | PRN
  Administered 2015-11-01: 25 ug via INTRAVENOUS

## 2015-11-01 MED ORDER — HEPARIN SODIUM (PORCINE) 5000 UNIT/ML IJ SOLN
INTRAMUSCULAR | Status: DC | PRN
Start: 2015-11-01 — End: 2015-11-01
  Administered 2015-11-01 (×2): 500 mL

## 2015-11-01 MED ORDER — ONDANSETRON HCL 4 MG/2ML IJ SOLN
INTRAMUSCULAR | Status: AC
  Filled 2015-11-01: qty 2

## 2015-11-01 MED ORDER — HEPARIN SODIUM (PORCINE) 1000 UNIT/ML IJ SOLN
INTRAMUSCULAR | Status: AC
  Filled 2015-11-01: qty 1

## 2015-11-01 MED ORDER — DEXAMETHASONE SODIUM PHOSPHATE 4 MG/ML IJ SOLN
INTRAMUSCULAR | Status: DC | PRN
  Administered 2015-11-01: 8 mg via INTRAVENOUS

## 2015-11-01 MED ORDER — LORATADINE 10 MG PO TABS
10.0000 mg | ORAL_TABLET | Freq: Every day | ORAL | Status: DC
  Administered 2015-11-02 – 2015-11-03 (×2): 10 mg via ORAL
  Filled 2015-11-01 (×3): qty 1

## 2015-11-01 MED ORDER — LIDOCAINE HCL (CARDIAC) 20 MG/ML IV SOLN
INTRAVENOUS | Status: AC
  Filled 2015-11-01: qty 5

## 2015-11-01 MED ORDER — PHENOL 1.4 % MT LIQD: 1.0000 | OROMUCOSAL | Status: DC | PRN

## 2015-11-01 MED ORDER — ONDANSETRON HCL 4 MG/2ML IJ SOLN
INTRAMUSCULAR | Status: DC | PRN
  Administered 2015-11-01: 4 mg via INTRAVENOUS

## 2015-11-01 MED ORDER — PROMETHAZINE HCL 25 MG/ML IJ SOLN: 6.2500 mg | INTRAMUSCULAR | Status: DC | PRN

## 2015-11-01 MED ORDER — SENNOSIDES-DOCUSATE SODIUM 8.6-50 MG PO TABS
1.0000 | ORAL_TABLET | Freq: Every evening | ORAL | Status: DC | PRN
Start: 2015-11-01 — End: 2015-11-03

## 2015-11-01 MED ORDER — ONDANSETRON HCL 4 MG/2ML IJ SOLN: 4.0000 mg | Freq: Four times a day (QID) | INTRAMUSCULAR | Status: DC | PRN

## 2015-11-01 MED ORDER — ROCURONIUM BROMIDE 100 MG/10ML IV SOLN
INTRAVENOUS | Status: DC | PRN
  Administered 2015-11-01: 50 mg via INTRAVENOUS
  Administered 2015-11-01 (×2): 20 mg via INTRAVENOUS
  Administered 2015-11-01: 30 mg via INTRAVENOUS

## 2015-11-01 MED ORDER — DEXAMETHASONE SODIUM PHOSPHATE 4 MG/ML IJ SOLN
INTRAMUSCULAR | Status: AC
  Filled 2015-11-01: qty 2

## 2015-11-01 MED ORDER — SUGAMMADEX SODIUM 200 MG/2ML IV SOLN
INTRAVENOUS | Status: AC
  Filled 2015-11-01: qty 2

## 2015-11-01 MED ORDER — LACTATED RINGERS IV SOLN: INTRAVENOUS | Status: DC

## 2015-11-01 MED ORDER — FENTANYL CITRATE (PF) 250 MCG/5ML IJ SOLN
INTRAMUSCULAR | Status: AC
  Filled 2015-11-01: qty 5

## 2015-11-01 MED ORDER — METOPROLOL TARTRATE 1 MG/ML IV SOLN: 2.0000 mg | INTRAVENOUS | Status: DC | PRN

## 2015-11-01 MED ORDER — ACETAMINOPHEN 325 MG PO TABS: 325.0000 mg | ORAL_TABLET | ORAL | Status: DC | PRN

## 2015-11-01 MED ORDER — LACTATED RINGERS IV SOLN
INTRAVENOUS | Status: DC | PRN
  Administered 2015-11-01: 07:00:00 via INTRAVENOUS

## 2015-11-01 MED ORDER — OXYCODONE-ACETAMINOPHEN 5-325 MG PO TABS: 1.0000 | ORAL_TABLET | ORAL | Status: DC | PRN

## 2015-11-01 MED ORDER — GUAIFENESIN-DM 100-10 MG/5ML PO SYRP
15.0000 mL | ORAL_SOLUTION | ORAL | Status: DC | PRN
Start: 2015-11-01 — End: 2015-11-03

## 2015-11-01 MED ORDER — HEPARIN SODIUM (PORCINE) 1000 UNIT/ML IJ SOLN
INTRAMUSCULAR | Status: DC | PRN
  Administered 2015-11-01: 4 mL via INTRAVENOUS
  Administered 2015-11-01 (×2): 8 mL via INTRAVENOUS

## 2015-11-01 MED ORDER — DEXTROSE 5 % IV SOLN
1.5000 g | Freq: Two times a day (BID) | INTRAVENOUS | Status: AC
  Administered 2015-11-01 – 2015-11-02 (×2): 1.5 g via INTRAVENOUS
  Filled 2015-11-01 (×2): qty 1.5

## 2015-11-01 MED ORDER — SUGAMMADEX SODIUM 200 MG/2ML IV SOLN
INTRAVENOUS | Status: DC | PRN
  Administered 2015-11-01: 200 mg via INTRAVENOUS

## 2015-11-01 MED ORDER — HEPARIN SODIUM (PORCINE) 1000 UNIT/ML IJ SOLN
INTRAMUSCULAR | Status: AC
  Filled 2015-11-01: qty 2

## 2015-11-01 MED ORDER — POTASSIUM CHLORIDE CRYS ER 20 MEQ PO TBCR: 20.0000 meq | EXTENDED_RELEASE_TABLET | Freq: Every day | ORAL | Status: DC | PRN

## 2015-11-01 MED ORDER — EPHEDRINE SULFATE 50 MG/ML IJ SOLN
INTRAMUSCULAR | Status: AC
  Filled 2015-11-01: qty 1

## 2015-11-01 MED ORDER — PHENYLEPHRINE HCL 10 MG/ML IJ SOLN
10.0000 mg | INTRAVENOUS | Status: DC | PRN
  Administered 2015-11-01: 40 ug/min via INTRAVENOUS

## 2015-11-01 MED ORDER — FENTANYL CITRATE (PF) 100 MCG/2ML IJ SOLN
INTRAMUSCULAR | Status: AC
  Administered 2015-11-01: 25 ug via INTRAVENOUS
  Filled 2015-11-01: qty 2

## 2015-11-01 MED ORDER — FLUTICASONE PROPIONATE 50 MCG/ACT NA SUSP
1.0000 | Freq: Every day | NASAL | Status: DC
  Administered 2015-11-01 – 2015-11-03 (×3): 1 via NASAL
  Filled 2015-11-01: qty 16

## 2015-11-01 SURGICAL SUPPLY — 77 items
CANISTER SUCTION 2500CC (MISCELLANEOUS) ×2 IMPLANT
CANNULA VESSEL 3MM 2 BLNT TIP (CANNULA) ×2 IMPLANT
CATH ANGIO 5F BER2 65CM (CATHETERS) ×2 IMPLANT
CATH BEACON 5.038 65CM KMP-01 (CATHETERS) IMPLANT
CATH OMNI FLUSH .035X70CM (CATHETERS) ×2 IMPLANT
CATH QUICKCROSS SUPP .035X90CM (MICROCATHETER) ×2 IMPLANT
CLIP TI MEDIUM 24 (CLIP) ×2 IMPLANT
CLIP TI WIDE RED SMALL 24 (CLIP) ×2 IMPLANT
COVER PROBE W GEL 5X96 (DRAPES) ×2 IMPLANT
DEVICE CLOSURE PERCLS PRGLD 6F (VASCULAR PRODUCTS) ×1 IMPLANT
DEVICE TORQUE KENDALL .025-038 (MISCELLANEOUS) ×2 IMPLANT
DRSG TEGADERM 2-3/8X2-3/4 SM (GAUZE/BANDAGES/DRESSINGS) ×2 IMPLANT
DRYSEAL FLEXSHEATH 16FR 33CM (SHEATH) ×1
DRYSEAL FLEXSHEATH 18FR 33CM (SHEATH) ×1
ELECT CAUTERY BLADE 6.4 (BLADE) ×2 IMPLANT
ELECT REM PT RETURN 9FT ADLT (ELECTROSURGICAL) ×4
ELECTRODE REM PT RTRN 9FT ADLT (ELECTROSURGICAL) ×2 IMPLANT
EXCLUDER TNK LEG 31MX14X15 (Endovascular Graft) ×1 IMPLANT
EXCLUDER TRUNK LEG 31MX14X15 (Endovascular Graft) ×2 IMPLANT
GAUZE SPONGE 2X2 8PLY STRL LF (GAUZE/BANDAGES/DRESSINGS) ×1 IMPLANT
GLOVE BIO SURGEON STRL SZ7.5 (GLOVE) ×6 IMPLANT
GLOVE BIOGEL PI IND STRL 6.5 (GLOVE) ×1 IMPLANT
GLOVE BIOGEL PI IND STRL 8 (GLOVE) ×1 IMPLANT
GLOVE BIOGEL PI INDICATOR 6.5 (GLOVE) ×1
GLOVE BIOGEL PI INDICATOR 8 (GLOVE) ×1
GLOVE SS BIOGEL STRL SZ 6.5 (GLOVE) ×2 IMPLANT
GLOVE SUPERSENSE BIOGEL SZ 6.5 (GLOVE) ×2
GOWN STRL REUS W/ TWL LRG LVL3 (GOWN DISPOSABLE) ×4 IMPLANT
GOWN STRL REUS W/TWL LRG LVL3 (GOWN DISPOSABLE) ×4
GRAFT BALLN CATH 65CM (STENTS) ×1 IMPLANT
GRAFT HEMASHIELD 8MM (Vascular Products) ×1 IMPLANT
GRAFT VASC STRG 30X8KNIT (Vascular Products) ×1 IMPLANT
GUIDEWIRE ANGLED .035X150CM (WIRE) ×2 IMPLANT
KIT BASIN OR (CUSTOM PROCEDURE TRAY) ×2 IMPLANT
KIT ROOM TURNOVER OR (KITS) ×2 IMPLANT
LEG CONTRALATERAL 27X10 (Vascular Products) ×2 IMPLANT
LEG CONTRALATERAL 27X12 (Vascular Products) ×2 IMPLANT
LEG CONTRALATERAL 27X14 (Vascular Products) ×2 IMPLANT
LIQUID BAND (GAUZE/BANDAGES/DRESSINGS) ×2 IMPLANT
LOOP VESSEL MAXI BLUE (MISCELLANEOUS) ×4 IMPLANT
LOOP VESSEL MINI RED (MISCELLANEOUS) ×2 IMPLANT
NEEDLE PERC 18GX7CM (NEEDLE) ×2 IMPLANT
NS IRRIG 1000ML POUR BTL (IV SOLUTION) ×2 IMPLANT
PACK ENDOVASCULAR (PACKS) ×2 IMPLANT
PAD ARMBOARD 7.5X6 YLW CONV (MISCELLANEOUS) ×4 IMPLANT
PENCIL BUTTON HOLSTER BLD 10FT (ELECTRODE) ×2 IMPLANT
PERCLOSE PROGLIDE 6F (VASCULAR PRODUCTS) ×2
SHEATH AVANTI 11CM 8FR (MISCELLANEOUS) ×2 IMPLANT
SHEATH BRITE TIP 8FR 23CM (MISCELLANEOUS) ×2 IMPLANT
SHEATH DRYSEAL FLEX 16FR 33CM (SHEATH) ×1 IMPLANT
SHEATH DRYSEAL FLEX 18FR 33CM (SHEATH) ×1 IMPLANT
SPONGE GAUZE 2X2 STER 10/PKG (GAUZE/BANDAGES/DRESSINGS) ×1
SPONGE SURGIFOAM ABS GEL 100 (HEMOSTASIS) IMPLANT
STAPLER VISISTAT 35W (STAPLE) IMPLANT
STENT GRAFT BALLN CATH 65CM (STENTS) ×1
STOPCOCK MORSE 400PSI 3WAY (MISCELLANEOUS) ×2 IMPLANT
SUT PROLENE 5 0 C 1 24 (SUTURE) ×14 IMPLANT
SUT PROLENE 6 0 BV (SUTURE) ×2 IMPLANT
SUT SILK 2 0 (SUTURE) ×2
SUT SILK 2-0 18XBRD TIE 12 (SUTURE) ×2 IMPLANT
SUT SILK 3 0 (SUTURE) ×1
SUT SILK 3-0 18XBRD TIE 12 (SUTURE) ×1 IMPLANT
SUT SILK 4 0 (SUTURE) ×1
SUT SILK 4-0 18XBRD TIE 12 (SUTURE) ×1 IMPLANT
SUT VIC AB 2-0 SH 27 (SUTURE) ×1
SUT VIC AB 2-0 SH 27XBRD (SUTURE) ×1 IMPLANT
SUT VIC AB 3-0 SH 27 (SUTURE) ×2
SUT VIC AB 3-0 SH 27X BRD (SUTURE) ×2 IMPLANT
SUT VICRYL 4-0 PS2 18IN ABS (SUTURE) ×4 IMPLANT
SYR 30ML LL (SYRINGE) ×2 IMPLANT
SYR BULB IRRIGATION 50ML (SYRINGE) ×2 IMPLANT
TOWEL OR 17X24 6PK STRL BLUE (TOWEL DISPOSABLE) ×2 IMPLANT
TRAY FOLEY W/METER SILVER 16FR (SET/KITS/TRAYS/PACK) ×2 IMPLANT
TUBING HIGH PRESSURE 120CM (CONNECTOR) ×2 IMPLANT
WIRE AMPLATZ SS-J .035X180CM (WIRE) ×4 IMPLANT
WIRE BENTSON .035X145CM (WIRE) ×4 IMPLANT
WIRE LUNDERQUIST .035X180CM (WIRE) ×2 IMPLANT

## 2015-11-01 NOTE — H&P (View-Only) (Signed)
[facility] [place] HISTORY AND PHYSICAL   Referring Physician: Kirstie Peri, MD History of Present Illness:  Patient is a 82 y.o. male who presents for evaluation of  Abdominal aortic aneurysm. The patient has had a known aneurysm for 10-12 years. It has progressively slowly enlarged. Most recent ultrasound showed it was between 5 and 6 cm. The patient denies any abdominal or back pain. He has a family history of abdominal aortic aneurysm in his father who died during an operation for an aneurysm. His sister also had an abdominal aortic aneurysm but died from other causes. He is a former smoker but quit over 40 years ago.   The patient overall is very active. He walks 3 miles daily. He also does 30 minutes of weight training including body weight squats for 30 minutes 5 days per week. 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 Other medical problems include hyperlipidemia which is currently stable. He is not on aspirin due to previous history of nosebleeds with nasal polyps. He denies shortness of breath with exertion. He denies chest pain. He has no prior history of stroke or myocardial infarction.  Past Medical History  Diagnosis Date  . AAA (abdominal aortic aneurysm) (HCC)   . GERD (gastroesophageal reflux disease)   . Osteoarthritis   . Anxiety   . Varicose veins   . Hyperglycemia   . Hyperlipidemia   . Tinea corporis   . Vertigo     Past Surgical History  Procedure Laterality Date  . Varicose vein surgery    . Nasal sinus surgery    . Hernia repair Bilateral    left leg vein stripping ever weeks ago.  Social History Social History  Substance Use Topics  . Smoking status: Former Smoker    Types: Cigarettes    Quit date: 10/05/1968  . Smokeless tobacco: Never Used  . Alcohol Use: 0.0 oz/week    0 Standard drinks or equivalent per week    Family History Family History  Problem Relation Age of Onset  . AAA (abdominal aortic aneurysm) Father      Allergies  Allergies  Allergen Reactions  . Asa [Aspirin]     Avoids with hx of nasal polyps  . Coffee Flavor     Nocturnal jerking      Current Outpatient Prescriptions  Medication Sig Dispense Refill  . acetaminophen (TYLENOL) 650 MG CR tablet Take 650 mg by mouth every 8 (eight) hours as needed for pain.    . chlordiazePOXIDE (LIBRIUM) 5 MG capsule Take 5 mg by mouth at bedtime.    . clobetasol cream (TEMOVATE) 0.05 % Apply 1 application topically 2 (two) times daily.    . fluticasone (FLONASE) 50 MCG/ACT nasal spray Place 2 sprays into both nostrils daily.    . fluticasone (VERAMYST) 27.5 MCG/SPRAY nasal spray Place 2 sprays into the nose daily.    . metoprolol succinate (TOPROL-XL) 25 MG 24 hr tablet Take 25 mg by mouth daily.    Marland Kitchen omeprazole (PRILOSEC) 20 MG capsule Take 20 mg by mouth daily. Reported on 10/06/2015    . Probiotic Product (PROBIOTIC DAILY PO) Take 1 tablet by mouth daily. Reported on 10/06/2015     No current facility-administered medications for this visit.    ROS:   General:  No weight loss, Fever, chills  HEENT: No recent headaches, no nasal bleeding, no visual changes, no sore throat  Neurologic: No dizziness, blackouts, seizures. No recent symptoms of stroke or mini- stroke. No recent  episodes of slurred speech, or temporary blindness.  Cardiac: No recent episodes of chest pain/pressure, no shortness of breath at rest.  No shortness of breath with exertion.  Denies history of atrial fibrillation or irregular heartbeat  Vascular: No history of rest pain in feet.  No history of claudication.  No history of non-healing ulcer, No history of DVT   Pulmonary: No home oxygen, no productive cough, no hemoptysis,  No asthma or wheezing  Musculoskeletal:  [ ]  Arthritis, [ ]  Low back pain,  [ ]  Joint pain  Hematologic:No history of hypercoagulable state.  No history of easy bleeding.  No history of anemia  Gastrointestinal: No hematochezia or melena,  No  gastroesophageal reflux, no trouble swallowing  Urinary: [ ]  chronic Kidney disease, [ ]  on HD - [ ]  MWF or [ ]  TTHS, [ ]  Burning with urination, [ ]  Frequent urination, [ ]  Difficulty urinating;   Skin: No rashes  Psychological: No history of anxiety,  No history of depression   Physical Examination  Filed Vitals:   10/06/15 1317 10/06/15 1319 10/06/15 1326  BP: 160/88 160/90 141/82  Pulse: 58 58 58  Temp: 97.1 F (36.2 C)    Resp: 16    Height: 6\' 3"  (1.905 m)    Weight: 168 lb (76.204 kg)    SpO2: 95%      Body mass index is 21 kg/(m^2).  General:  Alert and oriented, no acute distress HEENT: Normal Neck: No bruit or JVD Pulmonary: Clear to auscultation bilaterally Cardiac: Regular Rate and Rhythm without murmur Abdomen: Soft, non-tender, non-distended,  Pulsatile mass in the epigastrium nontender Skin: No rash Extremity Pulses:  2+ radial, brachial,  3+ right femoral widened pulsation consistent with right common femoral aneurysm,  2-3+ popliteal pulses bilaterally also widened,  2+dorsalis pedis, posterior tibial pulses bilaterally Musculoskeletal: No deformity or edema  Neurologic: Upper and lower extremity motor 5/5 and symmetric  DATA:   [facility]. This showed 6 x 5 cm abdominal aortic aneurysm with ectatic common iliac arteries.   ASSESSMENT:   Asymptomatic 6 cm abdominal aortic aneurysm. 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 He may also have popliteal aneurysms.   PLAN:   Due to the patient's family history of aneurysms and clinical exam consistent with peripheral aneurysms as well we will obtain a CT angiogram of the chest abdomen and pelvis to make sure that he does not have a thoracic aneurysm in addition to his abdominal aortic aneurysm. This will also allow planning for possible stent grafting of his abdominal aortic aneurysm. We will also schedule the  patient for cardiology evaluation for risk stratification. He'll return for follow-up with me after his cardiology evaluation and CT scan. If he is a candidate for repair he would prefer to have his aneurysm fixed between the week of March 13 the 17th while his daughter is off from work.  Fabienne Brunsharles Fields, MD [facility] Office: [phone] Pager: [phone]

## 2015-11-01 NOTE — Transfer of Care (Signed)
Immediate Anesthesia Transfer of Care Note  Patient: Devin Shaw  Procedure(s) Performed: Procedure(s): ABDOMINAL AORTIC ENDOVASCULAR STENT GRAFT WITH REPAIR OF RIGHT COMMON FEMORAL ARTERY ANEURYSM (N/A)  Patient Location: PACU  Anesthesia Type:General  Level of Consciousness: oriented, sedated, patient cooperative and responds to stimulation  Airway & Oxygen Therapy: Patient Spontanous Breathing and Patient connected to nasal cannula oxygen  Post-op Assessment: Report given to RN, Post -op Vital signs reviewed and stable and Patient moving all extremities X 4  Post vital signs: Reviewed and stable  Last Vitals:  Filed Vitals:   11/01/15 0551  BP: 176/90  Pulse: 66  Temp: 36.4 C  Resp: 18    Complications: No apparent anesthesia complications

## 2015-11-01 NOTE — Op Note (Signed)
Procedure: Gore Excluder stent graft repair of infrarenal abdominal aortic aneurysm, repair of right common femoral aneurysm with 8 mm Dacron interposition graft  Operative findings:   #1 left femoral Proglide closure   #2 31 x14x15 cm main body Gore Excluder device delivered via a right femoral system 18 French dry seal   #3 27 x 14 cm left iliac limb contralateral left 16 French dry seal    #4 27 x 12 cm contralateral iliac extension left   #5 27 x 10  ipsilateral iliac extension right  Co-surgeon: Cari Carawayhris Dickson M.D.  Assistant: Lianne CureMaureen Collins PA-C  PROCEDURE DETAIL: After obtaining informed consent the patient was taken to the operating room. The patient was placed in supine position the operating room table. After induction of general anesthesia and endotracheal intubation a Foley catheter was placed. Next the patient was prepped and draped in usual sterile fashion from the nipples down to the knees. Ultrasound was used to identify the left common femoral artery as well as the femoral bifurcation. An 11 blade was used to make a small neck in the skin over the level of the left common femoral artery. An introducer needle was then used to cannulate the left common femoral artery without difficulty. A 0.035 Bentson wire was then threaded up into the abdominal aorta through the left femoral system. A short 9 French dilator was placed over the guidewire the left femoral system. This was used to dilate the tract. The dilator was then removed and a Proglide device inserted over the guidewire into the right femoral system and this was deployed at the 2:00 position. The Proglide device was then removed and an additional Proglide device was brought in operative field and deployed at the 10:00 position. The sutures were kept separate and tagged with suture tags. Next the short 9 French sheath was then placed back over the guidewire into the left femoral system and the dilator removed and the sheath  thoroughly flushed with heparinized saline.   Attention was then turned to the right groin. A longitudinal incision was made in the right groin to down through the subcutaneous tissues down to level of the right common femoral artery. Common femoral artery was dissected free circumferentially at the level of the inguinal ligament. I then dissected the artery free just underneath the inguinal ligament and the external iliac artery was dissected free circumferentially and a vessel loop placed around this. A vessel loop was also placed around one of the circumflex iliac branches. Dissection was then carried down to the main portion of the common femoral artery. There was a large aneurysm in this location measuring 3-4 cm diameter. Dissection was carried out the level of the takeoff of the superficial femoral and profunda femoris origins. These were dissected free circumferentially and vessel loops placed around these.  Attention was then turned back to the left groin. The Bentson wire was threaded up into the proximal abdominal aorta. A 5 JamaicaFrench KMP catheter was then placed over this. This was then exchanged for an 035 Amplatz wire. The sheath and KMP catheter were removed over the Amplatz wire and a 16 French dry seal sheath was placed over the guidewire up into the abdominal aorta. This was thoroughly flushed with heparinized saline. At this point in the case the patient was given 8000 units of intravenous heparin. The patient was given an additional 8000 units of heparin and an additional 4000 units of heparin bolus during the course of the case.  At this point,  a 5 [place] Omni flush catheter was placed over the guidewire and the left groin up into the abdominal aorta. An abdominal aortogram was obtained in the AP position to determine level of the left and right renal arteries. At this point a 31 x 14 x 15 Gore Excluder main body device was selected. The Bentson wire from the right groin was advanced up into  the descending thoracic aorta over a Kumpe catheter and the Bentsen wire replaced with an 035 Amplatz wire. An 74 French dry seal sheath was then placed over the guidewire into the right femoral system. The main body device was then placed over the Amplatz wire in the right groin and advanced up to the level of the renal arteries. Magnified views of the renal arteries were performed to make sure that we were not covering these. The top portion of the stent graft was then deployed with the end of the stent just below the level of the left renal artery and this came over to just below the level of the right renal artery. There was some tortuosity of the neck so we re-constrained device once in the redeployed it slightly lower and it was too low at this point in the midportion up slightly higher and there was good apposition to the wall just below the level of the left renal and right renal arteries. The main body was delivered all the way down to the contralateral gate. Attention was then turned to the left groin. The Omni flush catheter was pulled down over a guidewire and removed and the 035 angled glidewire placed in position to cannulate the contralateral gate. The KMP catheter was placed back over the guidewire in the left femoral system. A 5 [place] KMP catheter was placed over this and this was used to selectively catheterize the contralateral gate and the guidewire was then advanced into the descending thoracic aorta. The main body portion of the gate was confirmed by twirling the pigtail catheter. The pigtail  catheter was then placed in a location so that we could use its markers to determine the exact length to the iliac bifurcation. An Amplatz wire was placed back through the pigtail catheter. A retrograde contrast angiogram was performed to determine the level of the left internal iliac artery and a 27 x 14 cm iliac limb was selected. The pigtail catheter was removed over the guidewire and the 27 x 14 cm  limb advanced so there was full coverage of the long marker on the contralateral limb. This was then deployed in the usual fashion down to the iliac bifurcation. The delivery system was then removed. There had been significant shortening of the right iliac limb and due to tortuosity of the vessels there was almost a kink at the distal portion of limb. Therefore before completely removing the delivery system for the iliac limb this was rewired with an 035 angled Glidewire through the delivery system after removing the tuohy valve and the outer gasket. Due to tortuosity the Glidewire would only pass slightly into the main body of the graft. I then tried to advance a 5 [place] KMP catheter over this to maintaining wire access but we were starting loose purchase of the wire. Therefore the KMP catheter was removed and a quick cross catheter was slowly and deliberately advanced over the Glidewire and then both of these as a unit to finally advance the wire up into the descending thoracic aorta.  A retrograde contrast angiogram was also performed to make sure  that the right iliac limb did not cover the left internal iliac artery.  There was significant shortening on the left side so I felt we needed an additional stent to extend all the way down to the iliac bifurcation. Measurement was made of the left iliac bifurcation and a 27 x12 cm limb was placed extending the left leg. The remainder of the ipsilateral iliac limb was also deployed.  This was then extended with a 27 x 10 cm bell bottom for the right side. This device was placed over the guidewire into the right iliac system and with good overlap of the right iliac limb deployed down to the right iliac bifurcation. Retrograde contrast angiogram was also performed to make sure that we were not covering the right internal iliac. Attention was then turned back to the left iliac system and a Gore Q50 aortic balloon placed over the wire up to the level of the proximal end of  the stent and this was ballooned to profile. The limb attachment site was also ballooned as well as the distal attachment site. Attention was then turned to the right groin and the balloon was advanced over the guidewire on the right side and the distal attachment site as well as the midportion of iliac limb were also ballooned. The 5 [place] Omni flush catheter was then placed back to the guidewire on the right side and a completion arteriogram was obtained. This showed no evidence of proximal or distal type I endoleak. There was no type II endoleak. There is no filling of the aneurysm sac.    At this point the Omni flush catheter was removed over a guidewire. All delivery devices were removed. We then proceeded to remove the large 16 French sheath from the left side with the guidewire in place. With pressure held above and below the insertion site the lateral Proglide closure was secured down as well as the medial device.  There was good hemostasis. The guidewire was removed from the left side. The skin was reapproximated using a running 4 Vicryl suture.   Attention was then turned to the right groin. In similar fashion the 16 French sheath was removed and I controlled the distal right external iliac artery with a Henley clamp the superficial femoral profundus arteries were controlled with a peripheral DeBakey and Henley clamp respectively.  The proximal common femoral artery was then transected just above the aneurysm. I then also transected the distal common femoral artery just above the bifurcation so that all aneurysmal tissue was removed. An 8 mm Dacron graft was then brought up in the operative field and sewn end-to-end to the femoral bifurcation using a running 5-0 Prolene suture. Just prior completion anastomosis was forebled backbled and thoroughly flushed. Anastomosis was secured clamps released and a peripheral DeBakey clamp moved up onto the Dacron graft after thoroughly flushed with heparinized  saline. The graft was then cut to length and sewn end-to-end to the proximal common femoral artery using a running 5-0 Prolene suture. Just prior completion anastomosis was forebled backbled and thoroughly flushed. Flow was then restored first to the profunda from the common femoral and then down the superficial femoral artery after approximate 5 cardiac cycles. There was some bleeding on the posterior wall of the distal anastomosis where the suture line was slightly loose. This was tightened up and repaired with several 5-0 Prolene sutures. Hemostasis was then obtained. The feet were inspected and found to have good Doppler signals in both feet. The patient was given  50 mg protamine for partial reversal of heparin.   The patient tolerated procedure well and there were no complications. Instrument sponge and needle counts were correct at the end of the case. The patient was awakened in the operating room extubated and taken to the recovery room in stable condition.  Fabienne Bruns, MD [facility] [place] Office: [phone] Pager: [phone]

## 2015-11-01 NOTE — Anesthesia Procedure Notes (Signed)
Procedure Name: Intubation Date/Time: 11/01/2015 7:47 AM Performed by: Rosiland OzMEYERS, Perpetua Elling Pre-anesthesia Checklist: Patient identified, Emergency Drugs available, Patient being monitored, Timeout performed and Suction available Patient Re-evaluated:Patient Re-evaluated prior to inductionOxygen Delivery Method: Circle system utilized Preoxygenation: Pre-oxygenation with 100% oxygen Intubation Type: IV induction Ventilation: Mask ventilation without difficulty Laryngoscope Size: Miller and 2 Grade View: Grade I Tube type: Oral Tube size: 7.5 mm Number of attempts: 1 Airway Equipment and Method: Stylet Placement Confirmation: ETT inserted through vocal cords under direct vision,  positive ETCO2 and breath sounds checked- equal and bilateral Secured at: 23 cm Tube secured with: Tape Dental Injury: Teeth and Oropharynx as per pre-operative assessment

## 2015-11-01 NOTE — Interval H&P Note (Signed)
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 After consideration of risks, benefits and other options for treatment, the patient has consented to  Procedure(s): ABDOMINAL AORTIC ENDOVASCULAR STENT GRAFT WITH REPAIR OF RIGHT COMMON FEMORAL ARTERY ANEURYSM (N/A) as a surgical intervention .  The patient's history has been reviewed, patient examined, no change in status, stable for surgery.  I have reviewed the patient's chart and labs.  Questions were answered to the patient's satisfaction.     Fabienne BrunsFields, Karel Mowers

## 2015-11-01 NOTE — OR Nursing (Signed)
BR initiated at 1200.

## 2015-11-01 NOTE — Anesthesia Postprocedure Evaluation (Signed)
Anesthesia Post Note  Patient: Monna FamJohn L Bowmer  Procedure(s) Performed: Procedure(s) (LRB): ABDOMINAL AORTIC ENDOVASCULAR STENT GRAFT WITH REPAIR OF RIGHT COMMON FEMORAL ARTERY ANEURYSM (N/A)  Patient location during evaluation: PACU Anesthesia Type: General Level of consciousness: awake and alert Pain management: pain level controlled Vital Signs Assessment: post-procedure vital signs reviewed and stable Respiratory status: spontaneous breathing, nonlabored ventilation, respiratory function stable and patient connected to nasal cannula oxygen Cardiovascular status: blood pressure returned to baseline and stable Postop Assessment: no signs of nausea or vomiting Anesthetic complications: no    Last Vitals:  Filed Vitals:   11/01/15 1445 11/01/15 1500  BP:    Pulse: 74 67  Temp:    Resp: 20 22    Last Pain: There were no vitals filed for this visit.               Shelton SilvasKevin D Hollis

## 2015-11-02 ENCOUNTER — Other Ambulatory Visit: Payer: Self-pay | Admitting: *Deleted

## 2015-11-02 ENCOUNTER — Encounter (HOSPITAL_COMMUNITY): Payer: Self-pay | Admitting: Vascular Surgery

## 2015-11-02 DIAGNOSIS — I714 Abdominal aortic aneurysm, without rupture, unspecified: Secondary | ICD-10-CM

## 2015-11-02 DIAGNOSIS — Z48812 Encounter for surgical aftercare following surgery on the circulatory system: Secondary | ICD-10-CM

## 2015-11-02 LAB — BASIC METABOLIC PANEL
ANION GAP: 9 (ref 5–15)
BUN: 18 mg/dL (ref 6–20)
CO2: 27 mmol/L (ref 22–32)
Calcium: 8.1 mg/dL — ABNORMAL LOW (ref 8.9–10.3)
Chloride: 106 mmol/L (ref 101–111)
Creatinine, Ser: 0.88 mg/dL (ref 0.61–1.24)
GFR calc Af Amer: >60 mL/min (ref >60)
GLUCOSE: 135 mg/dL — AB (ref 65–99)
POTASSIUM: 3.8 mmol/L (ref 3.5–5.1)
Sodium: 142 mmol/L (ref 135–145)

## 2015-11-02 LAB — CBC
HEMATOCRIT: 33.8 % — AB (ref 39.0–52.0)
HEMOGLOBIN: 11.1 g/dL — AB (ref 13.0–17.0)
MCH: 31.1 pg (ref 26.0–34.0)
MCHC: 32.8 g/dL (ref 30.0–36.0)
MCV: 94.7 fL (ref 78.0–100.0)
PLATELETS: 90 10*3/uL — AB (ref 150–400)
RBC: 3.57 MIL/uL — AB (ref 4.22–5.81)
RDW: 14.1 % (ref 11.5–15.5)
WBC: 9.2 10*3/uL (ref 4.0–10.5)

## 2015-11-02 MED ORDER — OXYCODONE-ACETAMINOPHEN 5-325 MG PO TABS: 1.0000 | ORAL_TABLET | ORAL | Status: DC | PRN

## 2015-11-02 MED ORDER — METOPROLOL SUCCINATE ER 25 MG PO TB24
12.5000 mg | ORAL_TABLET | Freq: Every day | ORAL | Status: DC
  Administered 2015-11-03: 12.5 mg via ORAL
  Filled 2015-11-02: qty 1

## 2015-11-02 MED ORDER — OXYCODONE-ACETAMINOPHEN 5-325 MG PO TABS
1.0000 | ORAL_TABLET | Freq: Four times a day (QID) | ORAL | Status: DC | PRN
  Administered 2015-11-02 – 2015-11-03 (×2): 1 via ORAL
  Filled 2015-11-02 (×2): qty 1

## 2015-11-02 MED ORDER — MORPHINE SULFATE (PF) 2 MG/ML IV SOLN: 1.0000 mg | INTRAVENOUS | Status: DC | PRN

## 2015-11-02 MED ORDER — ASPIRIN EC 81 MG PO TBEC: 81.0000 mg | DELAYED_RELEASE_TABLET | Freq: Every day | ORAL | Status: AC

## 2015-11-02 NOTE — Progress Notes (Signed)
Patient was bladder scanned and only had about 30 ml showing up. Pt reports that he voided after walking with staff. The staff member that walked with him is no longer here to verify. Continue to monitor.

## 2015-11-02 NOTE — Progress Notes (Addendum)
Pt has pill organizer from home. Pt trying to take medication from home. Pt educated about medication safety. Medication taken to pharmacy to keep safely until discharge. Pt medication receipt in chart.    Sandrea HammondJunris Cybil Senegal RN

## 2015-11-02 NOTE — Op Note (Signed)
    NAME: Devin Shaw    MRN: [id number] DOB: 12/12/1923    DATE OF OPERATION: 11/02/2015  PREOP DIAGNOSIS: abdominal aortic aneurysm  POSTOP DIAGNOSIS: same  PROCEDURE:  1. Ultrasound-guided pre-closure of left common femoral artery 2. Perclose left common femoral artery 3. Introduction of contralateral limb of EVAR  CO-SURGEON's: Di Kindlehristopher S. Edilia Boickson, MD, Fabienne Brunsharles Fields, MD  ASSIST: Lianne CureMaureen Collins PA  ANESTHESIA: Gen.   EBL: per anesthesia record  INDICATIONS: Devin Shaw is a 82 y.o. male presents for elective repair of an abdominal aortic aneurysm.  FINDINGS: completion film showed no endoleak with graft in excellent position.  TECHNIQUE: Given the complexity of the case, and the patients comorbidities, a two team approach was used in order to expedite the procedure. After the abdomen and groins and prepped and draped in usual sterile fashion, on the left side, under ultrasound guidance, an incision was made in the skin with an 11 blade and the soft tissue dissected with a hemostat. The left common femoral artery was cannulated under direct ultrasound guidance and a Benson wire introduced into the infrarenal aorta under fluoroscopic control. The artery was preclosed using 2 Perclose devices. The initial device was rotated 10 medially. The second device was rotated 10 laterally. Initially an 8 JamaicaFrench sheath was introduced on the left but subsequently a 12 JamaicaFrench sheath was introduced over an Materials engineerAmplatz wire.  After the trunk ipsilateral component had been deployed, the contralateral gate was cannulated with an angled Glidewire using a directional catheter. The angled Glidewire was exchanged for a Lunderquist wire. Retrograde iliac arteriogram was obtained on the left demonstrate the position of the left hypogastric artery. The left iliac limb was advanced over the Lunderquist wire and positioned into the contralateral gate and the plate without difficulty. The iliac extension limb was  likewise deployed extending to just above the hypogastric artery.  At the completion of the procedure, the sheath was removed and the Perclose devices secured with good hemostasis. The wire was then removed and additional hemostasis obtained with the Perclose devices. There was good hemostasis. The sutures were cut. The skin incision was closed with 4-0 Monocryl.    Waverly Ferrarihristopher Dickson, MD, FACS [facility]  DATE OF DICTATION:   11/02/2015

## 2015-11-02 NOTE — Care Management Note (Addendum)
Case Management Note  Patient Details  Name: Devin Shaw MRN: [id number] Date of Birth: 07/26/1924  Subjective/Objective:     Date: 11/02/15 Spoke with patient at the bedside along with daughter, Clovia CuffMary Shaw [phone].  Introduced self as Sports coachcase manager and explained role in discharge planning and how to be reached.  Verified patient lives in town, alone, daughter will be staying with him for a week to assist him. Expressed potential need for no DME.  Verified patient anticipates to go home with daughter assisting him at time of discharge and will have full-time supervision. Patient denied needing help with their medication.  Patient drives but will be driven by daughter to MD appointments if needed.  Verified patient has PCP Dr. Sherryll BurgerShah. Patient is up ambulating with nursing tech. Await pt/ot eval.    Plan: CM will continue to follow for discharge planning and [facility] resources.                Action/Plan:   Expected Discharge Date:  11/02/15               Expected Discharge Plan:  Home w Home Health Services  In-House Referral:     Discharge planning Services  CM Consult  Post Acute Care Choice:    Choice offered to:     DME Arranged:    DME Agency:     HH Arranged:    HH Agency:     Status of Service:  In process, will continue to follow  Medicare Important Message Given:    Date Medicare IM Given:    Medicare IM give by:    Date Additional Medicare IM Given:    Additional Medicare Important Message give by:     If discussed at Long Length of Stay Meetings, dates discussed:    Additional Comments:  Leone Havenaylor, Virlan Kempker [place], RN 11/02/2015, 11:11 AM

## 2015-11-02 NOTE — Progress Notes (Addendum)
  [facility] Progress Note  Subjective  - POD #1  Soreness with right groin incision  Objective Filed Vitals:   11/02/15 0000 11/02/15 0342  BP: 106/60 97/64  Pulse: 71 60  Temp:  97.7 F (36.5 C)  Resp: 11 17  Tmax 97.7 BP sys 100s-150s  Intake/Output Summary (Last 24 hours) at 11/02/15 0734 Last data filed at 11/02/15 0344  Gross per 24 hour  Intake 1058.33 ml  Output   2100 ml  Net -1041.67 ml    Left groin and right groin incisions clean without hematoma Doppler flow DPs bilaterally  Assessment/Planning: 82 y.o. male is s/p: Gore Excluder stent graft repair of infrarenal abdominal aortic aneurysm, repair of right common femoral aneurysm with 8 mm Dacron interposition graft 1 Day Post-Op   Incisions intact without hematoma Good doppler flow to both feet ABLA tolerating Good UOP, creatinine normal Needs to void and ambulate, then ok for d/c.  F/u in 4 weeks with CTA.   Raymond GurneyKimberly A Trinh 11/02/2015 7:34 AM --  Laboratory CBC    Component Value Date/Time   WBC 9.2 11/02/2015 0500   HGB 11.1* 11/02/2015 0500   HCT 33.8* 11/02/2015 0500   PLT 90* 11/02/2015 0500    BMET    Component Value Date/Time   NA 142 11/02/2015 0500   K 3.8 11/02/2015 0500   CL 106 11/02/2015 0500   CO2 27 11/02/2015 0500   GLUCOSE 135* 11/02/2015 0500   BUN 18 11/02/2015 0500   CREATININE 0.88 11/02/2015 0500   CREATININE 1.01 10/06/2015 1520   CALCIUM 8.1* 11/02/2015 0500   GFRNONAA >60 11/02/2015 0500   GFRAA >60 11/02/2015 0500    COAG Lab Results  Component Value Date   INR 1.17 11/01/2015   INR 1.05 10/27/2015   No results found for: PTT  Antibiotics Anti-infectives    Start     Dose/Rate Route Frequency Ordered Stop   11/01/15 1900  cefUROXime (ZINACEF) 1.5 g in dextrose 5 % 50 mL IVPB     1.5 g 100 mL/hr over 30 Minutes Intravenous Every 12 hours 11/01/15 1852 11/02/15 1859   11/01/15 0700  cefUROXime (ZINACEF) 1.5 g in dextrose 5 % 50  mL IVPB     1.5 g 100 mL/hr over 30 Minutes Intravenous To ShortStay Surgical 10/31/15 0749 11/01/15 0747       Maris BergerKimberly Trinh, PA-C [facility] Office: [phone] Pager: [phone] 11/02/2015 7:34 AM  Agree with above. Incisions clean without hematoma  Some confusion last night.  Choked on liquids last night.  Has yet to get out of bed. Will see how he does with Foley out and walking.  Low threshold to keep another day if confused or not moving well.  If he stays will transfer to 2W [place] lock IV Advance diet but watch swallowing  Fabienne Brunsharles Farmer Mccahill, MD [facility] [place] Office: [phone] Pager: [phone]

## 2015-11-02 NOTE — Evaluation (Signed)
Physical Therapy Evaluation Patient Details Name: Devin Shaw MRN: [id number] DOB: 11/14/1923 Today's Date: 11/02/2015   History of Present Illness  Patient is a 82 y.o. male who presents for evaluation of Abdominal aortic aneurysm.  Now s/p: Gore Excluder stent graft repair of infrarenal abdominal aortic aneurysm, repair of right common femoral aneurysm with 8 mm Dacron interposition graft  PMH positive for GERD, OA.  Clinical Impression  Patient presents with decreased mobility due to deficits listed in PT problem list below (mainly due to pain in R groin and general imbalance with bedrest.)  He will benefit from skilled PT in the acute setting to allow return home with daughter help initially and HHPT.    Follow Up Recommendations Home health PT;Supervision/Assistance - 24 hour    Equipment Recommendations  None recommended by PT    Recommendations for Other Services       Precautions / Restrictions Precautions Precautions: Fall      Mobility  Bed Mobility Overal bed mobility: Modified Independent             General bed mobility comments: with rail and HOB elevated (has hosptial bed at home)  Transfers Overall transfer level: Needs assistance Equipment used: Rolling walker (2 wheeled) Transfers: Sit to/from Stand Sit to Stand: Min assist         General transfer comment: increased time and heavy UE support from low recliner and bed due to long legs and difficulty flexing at hip with sore incision  Ambulation/Gait Ambulation/Gait assistance: Min guard;Min assist Ambulation Distance (Feet): 400 Feet Assistive device: Rolling walker (2 wheeled) Gait Pattern/deviations: Step-through pattern;Narrow base of support;Drifts right/left;Decreased stride length;Scissoring     General Gait Details: occasionally tripping on his feet and difficulty especially on turns due to walker management  Stairs            Wheelchair Mobility    Modified Rankin (Stroke  Patients Only)       Balance Overall balance assessment: Needs assistance         Standing balance support: No upper extremity supported Standing balance-Leahy Scale: Fair Standing balance comment: stood about a minute no UE support with S for static balance                             Pertinent Vitals/Pain Pain Assessment: Faces Faces Pain Scale: Hurts little more Pain Location: groin incision especially with sit <> stand Pain Descriptors / Indicators: Sore Pain Intervention(s): Monitored during session;Repositioned    Home Living Family/patient expects to be discharged to:: Private residence Living Arrangements: Alone;Children Available Help at Discharge: Available 24 hours/day;Family (first week home) Type of Home: House Home Access: Stairs to enter Entrance Stairs-Rails: Right;Left Entrance Stairs-Number of Steps: 4 Home Layout: One level Home Equipment: Walker - 2 wheels;Cane - quad;Cane - single point;Bedside commode;Grab bars - tub/shower;Grab bars - toilet;Hospital bed      Prior Function Level of Independence: Independent         Comments: walked a 1-3 miles did weight training     Hand Dominance        Extremity/Trunk Assessment   Upper Extremity Assessment: Overall WFL for tasks assessed           Lower Extremity Assessment: RLE deficits/detail RLE Deficits / Details: slightly limited due to pain with strength testing       Communication   Communication: No difficulties  Cognition Arousal/Alertness: Awake/alert Behavior During Therapy: WFL for tasks  assessed/performed Overall Cognitive Status: Within Functional Limits for tasks assessed                      General Comments General comments (skin integrity, edema, etc.): Suggested to use urinal at night for awhile and discussed tub bench which patient can look into getting through [place] benefits if desired; also educated on fall prevention at home; encouraged sponge  bathing due to pt liked to sit in bottom of tub    Exercises        Assessment/Plan    PT Assessment Patient needs continued PT services  PT Diagnosis Abnormality of gait;Acute pain   PT Problem List Decreased strength;Decreased activity tolerance;Decreased balance;Decreased mobility;Decreased coordination;Decreased knowledge of precautions;Decreased knowledge of use of DME;Pain  PT Treatment Interventions DME instruction;Balance training;Gait training;Functional mobility training;Patient/family education;Stair training;Therapeutic exercise;Therapeutic activities   PT Goals (Current goals can be found in the Care Plan section) Acute Rehab PT Goals Patient Stated Goal: To go home, return to independent PT Goal Formulation: With patient Time For Goal Achievement: 11/09/15 Potential to Achieve Goals: Good    Frequency Min 3X/week   Barriers to discharge        Co-evaluation               End of Session Equipment Utilized During Treatment: Gait belt Activity Tolerance: Patient tolerated treatment well Patient left: in chair;with call bell/phone within reach;with family/visitor present           Time: 1520-1549 PT Time Calculation (min) (ACUTE ONLY): 29 min   Charges:   PT Evaluation $PT Eval Moderate Complexity: 1 Procedure PT Treatments $Gait Training: 8-22 mins   PT G CodesElray Mcgregor November 09, 2015, 4:50 PM  Sheran Lawless, PT [phone] 2015/11/09

## 2015-11-03 MED ORDER — OXYCODONE-ACETAMINOPHEN 5-325 MG PO TABS: 1.0000 | ORAL_TABLET | Freq: Four times a day (QID) | ORAL | Status: AC | PRN

## 2015-11-03 NOTE — Progress Notes (Addendum)
  Progress Note    11/03/2015 8:11 AM 2 Days Post-Op  Subjective:  States he feels good.  Says he walked yesterday and has been up and had a bath and shaved this morning.  Tm 99.8 now afebrile HR  60's-70's NSR 100's-120's systolic  90% RA  Filed Vitals:   11/02/15 2013 11/03/15 0430  BP: 128/71 102/48  Pulse: 74 66  Temp: 98 F (36.7 C) 98.5 F (36.9 C)  Resp: 18 18    Physical Exam: Cardiac:  regular Lungs:  Non labored Incisions:  Right groin is clean and dry and bilateral groins are soft without hematoma Extremities:  Easily palpable PT pulses bilaterally Abdomen:  Soft, NT/ND Neuro:  Alert and oriented to place, time, date and president.  CBC    Component Value Date/Time   WBC 9.2 11/02/2015 0500   RBC 3.57* 11/02/2015 0500   HGB 11.1* 11/02/2015 0500   HCT 33.8* 11/02/2015 0500   PLT 90* 11/02/2015 0500   MCV 94.7 11/02/2015 0500   MCH 31.1 11/02/2015 0500   MCHC 32.8 11/02/2015 0500   RDW 14.1 11/02/2015 0500    BMET    Component Value Date/Time   NA 142 11/02/2015 0500   K 3.8 11/02/2015 0500   CL 106 11/02/2015 0500   CO2 27 11/02/2015 0500   GLUCOSE 135* 11/02/2015 0500   BUN 18 11/02/2015 0500   CREATININE 0.88 11/02/2015 0500   CREATININE 1.01 10/06/2015 1520   CALCIUM 8.1* 11/02/2015 0500   GFRNONAA >60 11/02/2015 0500   GFRAA >60 11/02/2015 0500    INR    Component Value Date/Time   INR 1.17 11/01/2015 1210     Intake/Output Summary (Last 24 hours) at 11/03/15 0811 Last data filed at 11/03/15 0248  Gross per 24 hour  Intake   2401 ml  Output    375 ml  Net   2026 ml     Assessment:  82 y.o. male is s/p:  Gore Excluder stent graft repair of infrarenal abdominal aortic aneurysm, repair of right common femoral aneurysm with 8 mm Dacron interposition graft  2 Days Post-Op  Plan: -pt doing well this morning -bilateral groins are soft without hematoma -alert and oriented and less confused -DVT prophylaxis:  Lovenox -pt  states his daughter will be staying with him -discharge later this morning -f/u with Dr. Darrick PennaFields in 4 weeks with CTA   Doreatha MassedSamantha Rhyne, PA-C [facility] [phone] 11/03/2015 8:11 AM  Agree with above. D/c home.  Follow up with CTA in one month.  Fabienne Brunsharles Samanthajo Payano, MD [facility] [place] Office: [phone] Pager: [phone]

## 2015-11-03 NOTE — Discharge Instructions (Signed)
Wash the groin wound with soap and water daily and pat dry. (No tub bath-only shower)  Then put a dry gauze or washcloth there to keep this area dry daily and as needed.  Do not use Vaseline or neosporin on your incisions.  Only use soap and water on your incisions and then protect and keep dry. ° °

## 2015-11-03 NOTE — Discharge Summary (Signed)
[facility] Discharge Summary   Devin Shaw [birth year] 82 y.o. 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 male who presents for evaluation of Abdominal aortic aneurysm. The patient has had a known aneurysm for 10-12 years. It has progressively slowly enlarged. Most recent ultrasound showed it was between 5 and 6 cm. The patient denies any abdominal or back pain. He has a family history of abdominal aortic aneurysm in his father who died during an operation for an aneurysm. His sister also had an abdominal aortic aneurysm but died from other causes. He is a former smoker but quit over 40 years ago. The patient overall is very active. He walks 3 miles daily. He also does 30 minutes of weight training including body weight squats for 30 minutes 5 days per week. 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 Other medical problems include hyperlipidemia which is currently stable. He is not on aspirin due to previous history of nosebleeds with nasal polyps. He denies shortness of breath with exertion. He denies chest pain. He has no prior history of stroke or myocardial infarction.  Hospital Course:  The patient was admitted to the hospital and taken to the operating room on 11/01/2015 and underwent: Gore Excluder stent graft repair of infrarenal abdominal aortic aneurysm, repair of right common femoral aneurysm with 8 mm Dacron interposition graft     The pt tolerated the procedure well and was transported to the PACU in good condition.   By POD 1, he was doing well, but did have some confusion.  He was kept another day for observation.  He was transferred to [address] [place].    By POD 2, he was doing well and confusion had completely resolved.  He was eating well and ambulating without difficulty.  He was discharged  home.  The remainder of the hospital course consisted of increasing mobilization and increasing intake of solids without difficulty.  CBC    Component Value Date/Time   WBC 9.2 11/02/2015 0500   RBC 3.57* 11/02/2015 0500   HGB 11.1* 11/02/2015 0500   HCT 33.8* 11/02/2015 0500   PLT 90* 11/02/2015 0500   MCV 94.7 11/02/2015 0500   MCH 31.1 11/02/2015 0500   MCHC 32.8 11/02/2015 0500   RDW 14.1 11/02/2015 0500    BMET    Component Value Date/Time   NA 142 11/02/2015 0500   K 3.8 11/02/2015 0500   CL 106 11/02/2015 0500   CO2 27 11/02/2015 0500   GLUCOSE 135* 11/02/2015 0500   BUN 18 11/02/2015 0500   CREATININE 0.88 11/02/2015 0500   CREATININE 1.01 10/06/2015 1520   CALCIUM 8.1* 11/02/2015 0500   GFRNONAA >60 11/02/2015 0500   GFRAA >60 11/02/2015 0500       Discharge Instructions    Call MD for:  redness, tenderness, or signs of infection (pain, swelling, bleeding, redness, odor or green/yellow discharge around incision site)    Complete by:  As directed      Call MD for:  severe or increased pain, loss or decreased feeling  in affected limb(s)    Complete by:  As directed      Call MD for:  temperature >100.5    Complete by:  As directed      Driving Restrictions    Complete by:  As directed  No driving for 2 weeks and while on pain medication     Increase activity slowly    Complete by:  As directed   Walk with assistance use walker or cane as needed     Lifting restrictions    Complete by:  As directed   No lifting greater than gallon of milk for 3 weeks     Resume previous diet    Complete by:  As directed            Discharge Diagnosis:  Abdominal aortic aneurysm I71.4; 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 AAA (abdominal aortic aneurysm) (HCC) 11/01/2015  . Popliteal artery aneurysm (HCC) 10/14/2015  . 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  Screening for cardiovascular condition 10/11/2015  . AAA (abdominal aortic aneurysm) without rupture (HCC) 10/06/2015   Past Medical History  Diagnosis Date  . AAA (abdominal aortic aneurysm) (HCC)   . GERD (gastroesophageal reflux disease)   . Osteoarthritis   . Anxiety   . Varicose veins   . Hyperglycemia   . Hyperlipidemia   . Vertigo   . Popliteal fullness     has multiple aneurysms behind his left knee  . H/O seasonal allergies     ragweed  . Dry skin        Medication List    TAKE these medications        acetaminophen 650 MG CR tablet  Commonly known as:  TYLENOL  Take 650 mg by mouth every 8 (eight) hours as needed for pain.     aspirin EC 81 MG tablet  Take 1 tablet (81 mg total) by mouth daily.     cetirizine 10 MG tablet  Commonly known as:  ZYRTEC  Take 10 mg by mouth daily.     chlordiazePOXIDE 5 MG capsule  Commonly known as:  LIBRIUM  Take 5 mg by mouth at bedtime as needed for anxiety.     FLONASE 50 MCG/ACT nasal spray  Generic drug:  fluticasone  Place 1 spray into both nostrils daily.     metoprolol succinate 25 MG 24 hr tablet  Commonly known as:  TOPROL-XL  Take 12.5 mg by mouth daily.     oxyCODONE-acetaminophen 5-325 MG tablet  Commonly known as:  PERCOCET/ROXICET  Take 1-2 tablets by mouth every 4 (four) hours as needed for moderate pain.        Prescriptions given: Roxicet #20 No Refill  Instructions: 1.  Wash the groin wound with soap and water daily and pat dry. (No tub bath-only shower)  Then put a dry gauze or washcloth there to keep this area dry daily and as needed.  Do not use Vaseline or neosporin on your incisions.  Only use soap and water on your incisions and then protect and keep dry.  Disposition: home  Patient's condition: is Good  Follow up: 1. Dr. Darrick Penna in 4 weeks with CTA   Doreatha Massed, PA-C [facility] [phone] 11/03/2015  8:16 AM   - For VQI Registry use --- Instructions:  Press F2 to tab through selections.  Delete question if not applicable.   Post-op:  Time to Extubation:  In OR,  < 12 hrs,  12-24 hrs,  >=24 hrs Vasopressors Req. Post-op: No MI: No.,  Troponin only,  EKG or Clinical New Arrhythmia: No CHF: No ICU Stay: 1 day in stepdown Transfusion:  No  If yes, n/a units given  Complications: Resp failure: No., [ ]  Pneumonia, [ ]  Ventilator Chg in renal function: No., [ ]  Inc. Cr > 0.5, [ ]  Temp. Dialysis, [ ]  Permanent dialysis Leg ischemia: No., no Surgery needed, [ ]  Yes, Surgery needed, [ ]  Amputation Bowel ischemia: No., [ ]  Medical Rx, [ ]  Surgical Rx Wound complication: No., [ ]  Superficial separation/infection, [ ]  Return to OR Return to OR: No  Return to OR for bleeding: No Stroke: No., [ ]  Minor, [ ]  Major  Discharge medications: Statin use:  No If No: [ ]  For Medical reasons, [ ]  Non-compliant, [ ]  Not-indicated ASA use:  Yes  If No: [ ]  For Medical reasons, [ ]  Non-compliant, [ ]  Not-indicated Plavix use:  No If No: [ ]  For Medical reasons, [ ]  Non-compliant, [ ]  Not-indicated Beta blocker use:  Yes If No: [ ]  For Medical reasons, [ ]  Non-compliant, [ ]  Not-indicated

## 2015-11-03 NOTE — Progress Notes (Signed)
Physical Therapy Treatment Patient Details Name: Devin Shaw MRN: [id number] DOB: 1923-10-09 Today's Date: 11/03/2015    History of Present Illness Patient is a 82 y.o. male who presents for evaluation of Abdominal aortic aneurysm.  Now s/p: Gore Excluder stent graft repair of infrarenal abdominal aortic aneurysm, repair of right common femoral aneurysm with 8 mm Dacron interposition graft  PMH positive for GERD, OA.    PT Comments    Patient progressing well towards PT goals. Tolerated gait training and stair training today. Continues to have some difficulty managing RW esp with turns or when distracted. Requires Min guard for safety during stair training. Pt will have support from daughter at home. Discussed safety techniques at home and fall prevention. Will follow acutely.   Follow Up Recommendations  Home health PT;Supervision/Assistance - 24 hour     Equipment Recommendations  None recommended by PT    Recommendations for Other Services       Precautions / Restrictions Precautions Precautions: Fall Restrictions Weight Bearing Restrictions: No    Mobility  Bed Mobility Overal bed mobility: Modified Independent             General bed mobility comments: with rail and HOB elevated (has hosptial bed at home)  Transfers Overall transfer level: Needs assistance Equipment used: Rolling walker (2 wheeled) Transfers: Sit to/from Stand Sit to Stand: Min guard         General transfer comment: Min guard for safety.   Ambulation/Gait Ambulation/Gait assistance: Min assist Ambulation Distance (Feet): 400 Feet Assistive device: Rolling walker (2 wheeled) Gait Pattern/deviations: Step-through pattern;Decreased stride length;Drifts right/left;Scissoring Gait velocity: decreased   General Gait Details: occasionally tripping on his feet. Difficulty with turns within RW. Easily distracted and drifting gait noted with head turns.   Stairs Stairs: Yes Stairs  assistance: Min guard Stair Management: Step to pattern;One rail Right Number of Stairs: 4 General stair comments: Cues for technique and safety. Mild SOB. HR stable 96 bpm.  Wheelchair Mobility    Modified Rankin (Stroke Patients Only)       Balance Overall balance assessment: Needs assistance Sitting-balance support: Feet supported;No upper extremity supported Sitting balance-Leahy Scale: Normal Sitting balance - Comments: Able to reach outside BoS to feet and donn shoes sitting EOB. Increased time/effort.    Standing balance support: During functional activity Standing balance-Leahy Scale: Fair Standing balance comment: Able to stand unsupported and take some steps in room wihtout UE support however unsteady,                    Cognition Arousal/Alertness: Awake/alert Behavior During Therapy: WFL for tasks assessed/performed Overall Cognitive Status: Within Functional Limits for tasks assessed                      Exercises      General Comments General comments (skin integrity, edema, etc.): Reviewed using rollator at home for safety and fall prevention at home.      Pertinent Vitals/Pain Pain Assessment: Faces Faces Pain Scale: Hurts a little bit Pain Location: incision site Pain Descriptors / Indicators: Sore Pain Intervention(s): Monitored during session;Repositioned    Home Living                      Prior Function            PT Goals (current goals can now be found in the care plan section) Progress towards PT goals: Progressing toward goals    Frequency  Min 3X/week    PT Plan Current plan remains appropriate    Co-evaluation             End of Session Equipment Utilized During Treatment: Gait belt Activity Tolerance: Patient tolerated treatment well Patient left: in bed;with call bell/phone within reach     Time: 0931-0951 PT Time Calculation (min) (ACUTE ONLY): 20 min  Charges:  $Gait Training: 8-22  mins                    G Codes:      Alyce Inscore A Jameel Quant 11/03/2015, 10:16 AM Mylo RedShauna Kayani Rapaport, PT, DPT [phone]

## 2015-11-05 ENCOUNTER — Telehealth: Payer: Self-pay

## 2015-11-05 ENCOUNTER — Telehealth: Payer: Self-pay | Admitting: Vascular Surgery

## 2015-11-05 NOTE — Telephone Encounter (Signed)
notified patient's daughter of cta appt. and post op appt. with dr. Darrick Pennafields on 12-02-15 at 10:15 am

## 2015-11-05 NOTE — Telephone Encounter (Signed)
Phone call from pt's daughter.  Reported the pt. C/o constipation, and denies any BM x 1 week.  Stated there is "some abdominal bloating."  Denied nausea or vomiting.  Denied expelling gas.  Reported the pt. has been drinking vegetable powder daily, has taken Bisacodyl 10 mg tablet x 2 yesterday, and is drinking prune juice.  Daughter stated he is walking "a little" with a walker, and has had to rely on the pain medication regularly, to be able to move.  Also stated her father isn't drinking fluids very well.  Discussed with Dr. Edilia Boickson; recommended to cut back on the narcotic pain medication, increase his mobility, try Dulcolax supp., and to take Metamucil.  Called daughter back with recommendations by Dr. Edilia Boickson.  Also, encouraged to increase pt's oral fluid intake, and start him on stool softener OTC.  Advised if pt. has no success with the above tx's, he may need to be disimpacted and pt. Should go to the ER.  Daughter verb. understanding.

## 2015-11-25 ENCOUNTER — Ambulatory Visit (HOSPITAL_COMMUNITY)
Admission: RE | Admit: 2015-11-25 | Discharge: 2015-11-25 | Disposition: A | Discharge status: Home / Self Care | Payer: Medicare Other | Source: Ambulatory Visit | Attending: Vascular Surgery | Admitting: Vascular Surgery

## 2015-11-25 DIAGNOSIS — Z48812 Encounter for surgical aftercare following surgery on the circulatory system: Secondary | ICD-10-CM | POA: Diagnosis present

## 2015-11-25 DIAGNOSIS — I714 Abdominal aortic aneurysm, without rupture, unspecified: Secondary | ICD-10-CM

## 2015-11-25 MED ORDER — IOPAMIDOL (ISOVUE-370) INJECTION 76%
100.0000 mL | Freq: Once | INTRAVENOUS | Status: AC | PRN
  Administered 2015-11-25: 100 mL via INTRAVENOUS

## 2015-11-26 ENCOUNTER — Encounter: Payer: Self-pay | Admitting: Vascular Surgery

## 2015-12-02 ENCOUNTER — Encounter: Payer: Self-pay | Admitting: Vascular Surgery

## 2015-12-02 ENCOUNTER — Ambulatory Visit (INDEPENDENT_AMBULATORY_CARE_PROVIDER_SITE_OTHER): Payer: Medicare Other | Admitting: Vascular Surgery

## 2015-12-02 VITALS — BP 156/84 | HR 60 | Temp 97.3°F | Resp 14 | Ht 75.0 in | Wt 163.0 lb

## 2015-12-02 DIAGNOSIS — I714 Abdominal aortic aneurysm, without rupture, unspecified: Secondary | ICD-10-CM

## 2015-12-02 DIAGNOSIS — I724 Aneurysm of artery of lower extremity: Secondary | ICD-10-CM

## 2015-12-02 NOTE — Progress Notes (Signed)
Patient is an 82 year old male who returns for postoperative follow-up today. 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 This was uneventful. He returns today reporting no drainage from his right groin incision. He has essentially return to normal activities. However he currently is only walking 1 mile every day as opposed to the 3-1/2 miles he was walking every day preoperatively. He states he is still recovering overall. He denies any pain in his feet. He denies any abdominal or back pain.  Past Medical History  Diagnosis Date  . AAA (abdominal aortic aneurysm) (HCC)   . GERD (gastroesophageal reflux disease)   . Osteoarthritis   . Anxiety   . Varicose veins   . Hyperglycemia   . Hyperlipidemia   . Vertigo   . Popliteal fullness     has multiple aneurysms behind his left knee  . H/O seasonal allergies     ragweed  . Dry skin     Past Surgical History  Procedure Laterality Date  . Varicose vein surgery    . Nasal sinus surgery    . Hernia repair Bilateral   . Abdominal aortic endovascular stent graft N/A 11/01/2015    Procedure: ABDOMINAL AORTIC ENDOVASCULAR STENT GRAFT WITH REPAIR OF RIGHT COMMON FEMORAL ARTERY ANEURYSM;  Surgeon: Sherren Kerns, MD;  Location: [facility] OR;  Service: Vascular;  Laterality: N/A;    Current Outpatient Prescriptions on File Prior to Visit  Medication Sig Dispense Refill  . acetaminophen (TYLENOL) 650 MG CR tablet Take 650 mg by mouth every 8 (eight) hours as needed for pain.    Marland Kitchen aspirin EC 81 MG tablet Take 1 tablet (81 mg total) by mouth daily. 30 tablet 0  . cetirizine (ZYRTEC) 10 MG tablet Take 10 mg by mouth daily.    . chlordiazePOXIDE (LIBRIUM) 5 MG capsule Take 5 mg by mouth at bedtime as needed for anxiety.     . fluticasone (FLONASE) 50 MCG/ACT nasal spray Place 1 spray into both nostrils daily.     . metoprolol succinate (TOPROL-XL) 25 MG  24 hr tablet Take 12.5 mg by mouth daily.     Marland Kitchen oxyCODONE-acetaminophen (PERCOCET/ROXICET) 5-325 MG tablet Take 1 tablet by mouth every 6 (six) hours as needed for moderate pain. (Patient not taking: Reported on 12/02/2015) 20 tablet 0   No current facility-administered medications on file prior to visit.    Physical exam:  Filed Vitals:   12/02/15 1022  BP: 156/84  Pulse: 60  Temp: 97.3 F (36.3 C)  TempSrc: Oral  Resp: 14  Height:  (1.905 m)  Weight: 163 lb (73.936 kg)  SpO2: 95%    Extremities: Right groin incision well-healed feet pink warm bilaterally, 3+ left popliteal pulse 2+ dorsalis pedis and posterior tibial pulses bilaterally  Abdomen: Soft nontender nondistended 2+ femoral pulses  Data: Patient had a CT angiogram performed and I reviewed these images today. This shows the stent graft is in good position with no evidence of type I or type II endoleak no evidence of migration top and the stent graft is just below the renal arteries. Right common femoral graft is widely patent.  Assessment: Doing well status post right common femoral aneurysm and infrarenal abdominal aortic aneurysm repair. Patient has a known 3 cm left popliteal aneurysm and a known 2 cm left common femoral artery aneurysm as well as a smaller popliteal artery aneurysm on the right. The popliteal  and common femoral artery aneurysm on the left side are of the size to consider repair. However, the patient states he needs to recover further before considering any other operations. He'll return in 5 months time for repeat CT Angio of the abdomen and pelvis with runoff to evaluate his lower extremity arterial tree or his popliteal aneurysms for possible operative planning. We will have further discussions at that time regarding his popliteal and common femoral aneurysms.  Fabienne Brunsharles Sebastiana Wuest, MD [facility] [place] Office: [phone] Pager: [phone]

## 2016-01-20 ENCOUNTER — Other Ambulatory Visit: Payer: Self-pay | Admitting: *Deleted

## 2016-01-20 DIAGNOSIS — I724 Aneurysm of artery of lower extremity: Secondary | ICD-10-CM

## 2016-01-20 DIAGNOSIS — Z01810 Encounter for preprocedural cardiovascular examination: Secondary | ICD-10-CM

## 2016-01-21 ENCOUNTER — Other Ambulatory Visit: Payer: Self-pay | Admitting: *Deleted

## 2016-01-21 DIAGNOSIS — Z01812 Encounter for preprocedural laboratory examination: Secondary | ICD-10-CM

## 2016-01-25 ENCOUNTER — Other Ambulatory Visit: Payer: Self-pay

## 2016-01-26 ENCOUNTER — Ambulatory Visit (HOSPITAL_COMMUNITY)
Admission: RE | Admit: 2016-01-26 | Discharge: 2016-01-26 | Disposition: A | Discharge status: Home / Self Care | Payer: Medicare Other | Source: Ambulatory Visit | Attending: Vascular Surgery | Admitting: Vascular Surgery

## 2016-01-26 DIAGNOSIS — I771 Stricture of artery: Secondary | ICD-10-CM | POA: Insufficient documentation

## 2016-01-26 DIAGNOSIS — I728 Aneurysm of other specified arteries: Secondary | ICD-10-CM | POA: Insufficient documentation

## 2016-01-26 DIAGNOSIS — Z01810 Encounter for preprocedural cardiovascular examination: Secondary | ICD-10-CM | POA: Diagnosis not present

## 2016-01-26 DIAGNOSIS — I723 Aneurysm of iliac artery: Secondary | ICD-10-CM | POA: Insufficient documentation

## 2016-01-26 DIAGNOSIS — K551 Chronic vascular disorders of intestine: Secondary | ICD-10-CM | POA: Diagnosis not present

## 2016-01-26 DIAGNOSIS — I724 Aneurysm of artery of lower extremity: Secondary | ICD-10-CM | POA: Diagnosis present

## 2016-01-26 LAB — POCT I-STAT CREATININE: Creatinine, Ser: 1 mg/dL (ref 0.61–1.24)

## 2016-01-26 MED ORDER — IOPAMIDOL (ISOVUE-370) INJECTION 76%
125.0000 mL | Freq: Once | INTRAVENOUS | Status: AC | PRN
  Administered 2016-01-26: 125 mL via INTRAVENOUS

## 2016-01-28 ENCOUNTER — Encounter: Payer: Self-pay | Admitting: Vascular Surgery

## 2016-02-03 ENCOUNTER — Ambulatory Visit (INDEPENDENT_AMBULATORY_CARE_PROVIDER_SITE_OTHER): Payer: Medicare Other | Admitting: Vascular Surgery

## 2016-02-03 ENCOUNTER — Ambulatory Visit: Payer: Medicare Other | Admitting: Vascular Surgery

## 2016-02-03 ENCOUNTER — Other Ambulatory Visit: Payer: Self-pay

## 2016-02-03 ENCOUNTER — Encounter: Payer: Self-pay | Admitting: Vascular Surgery

## 2016-02-03 VITALS — BP 140/88 | HR 60 | Temp 97.0°F | Resp 16 | Ht 75.0 in | Wt 164.0 lb

## 2016-02-03 DIAGNOSIS — I714 Abdominal aortic aneurysm, without rupture, unspecified: Secondary | ICD-10-CM

## 2016-02-03 DIAGNOSIS — I724 Aneurysm of artery of lower extremity: Secondary | ICD-10-CM | POA: Diagnosis not present

## 2016-02-03 NOTE — Progress Notes (Signed)
Patient is an 82-year-old male who returns for postoperative follow-up today. 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 This was uneventful. He returns today reporting no drainage from his right groin incision. He has essentially return to normal activities. However he currently is only walking 1 mile every day as opposed to the 3-1/2 miles he was walking every day preoperatively. He states he is still recovering overall. He denies any pain in his feet. He denies any abdominal or back pain.    Past Medical History   Diagnosis  Date   .  AAA (abdominal aortic aneurysm) (HCC)     .  GERD (gastroesophageal reflux disease)     .  Osteoarthritis     .  Anxiety     .  Varicose veins     .  Hyperglycemia     .  Hyperlipidemia     .  Vertigo     .  Popliteal fullness         has multiple aneurysms behind his left knee   .  H/O seasonal allergies         ragweed   .  Dry skin         Past Surgical History   Procedure  Laterality  Date   .  Varicose vein surgery       .  Nasal sinus surgery       .  Hernia repair  Bilateral     .  Abdominal aortic endovascular stent graft  N/A  11/01/2015       Procedure: ABDOMINAL AORTIC ENDOVASCULAR STENT GRAFT WITH REPAIR OF RIGHT COMMON FEMORAL ARTERY ANEURYSM;  Surgeon: Charles E Fields, MD;  Location: [facility] OR;  Service: Vascular;  Laterality: N/A;       Current Outpatient Prescriptions on File Prior to Visit   Medication  Sig  Dispense  Refill   .  acetaminophen (TYLENOL) 650 MG CR tablet  Take 650 mg by mouth every 8 (eight) hours as needed for pain.       .  aspirin EC 81 MG tablet  Take 1 tablet (81 mg total) by mouth daily.  30 tablet  0   .  cetirizine (ZYRTEC) 10 MG tablet  Take 10 mg by mouth daily.       .  chlordiazePOXIDE (LIBRIUM) 5 MG capsule  Take 5 mg by mouth at bedtime as needed for anxiety.        .  fluticasone (FLONASE) 50  MCG/ACT nasal spray  Place 1 spray into both nostrils daily.        .  metoprolol succinate (TOPROL-XL) 25 MG 24 hr tablet  Take 12.5 mg by mouth daily.        .  oxyCODONE-acetaminophen (PERCOCET/ROXICET) 5-325 MG tablet  Take 1 tablet by mouth every 6 (six) hours as needed for moderate pain. (Patient not taking: Reported on 12/02/2015)  20 tablet  0      No current facility-administered medications on file prior to visit.     Physical exam:     Filed Vitals:   02/03/16 1318  BP: 140/88  Pulse: 60  Temp: 97 F (36.1 C)  TempSrc: Oral  Resp: 16  Height: 6' 3" (1.905 m)  Weight: 164 lb (74.39 kg)  SpO2: 94%   Chest: Clear to auscultation bilaterally.  Cardiac: Regular rate and rhythm without murmur Extremities:   Right groin incision well-healed feet pink warm bilaterally, 3+ left popliteal pulse 2+ posterior tibial pulses bilaterally Abdomen: Soft nontender nondistended 2+ femoral pulses  Data: Patient had a CT angiogram performed 6/1 and I reviewed these images today. This shows the stent graft is in good position with no evidence of type I or type II endoleak no evidence of migration top and the stent graft is just below the renal arteries. Right common femoral graft is widely patent. 3 cm left popliteal aneurysm 2 similar right popliteal aneurysm posterior tibial peroneal runoff bilaterally 2.3 cm left common femoral artery aneurysm  Assessment: Doing well status post right common femoral aneurysm and infrarenal abdominal aortic aneurysm repair. Patient has a known 3 cm left popliteal aneurysm and a known 2 cm left common femoral artery aneurysm as well as a smaller popliteal artery aneurysm on the right. The popliteal and common femoral artery aneurysm on the left side are of the size to consider repair. However, the patient states he needs to recover further before considering any other operations.   At this time he wishes to proceed with repair of the left popliteal aneurysm. We will  schedule this for Tuesday, June 13. Risk benefits possible, the patient's procedure details were discussed the patient today including but limited to bleeding infection graft thrombosis potential limb loss he understands and agrees to proceed.  Fabienne Brunsharles Fields, MD [facility] [place] Office: [phone] Pager: [phone]

## 2016-02-07 ENCOUNTER — Encounter (HOSPITAL_COMMUNITY): Payer: Self-pay

## 2016-02-07 ENCOUNTER — Encounter (HOSPITAL_COMMUNITY)
Admission: RE | Admit: 2016-02-07 | Discharge: 2016-02-07 | Disposition: A | Discharge status: Home / Self Care | Payer: Medicare Other | Source: Ambulatory Visit | Attending: Vascular Surgery | Admitting: Vascular Surgery

## 2016-02-07 HISTORY — DX: Cardiac murmur, unspecified: R01.1

## 2016-02-07 HISTORY — DX: Unspecified asthma, uncomplicated: J45.909

## 2016-02-07 LAB — COMPREHENSIVE METABOLIC PANEL
ALK PHOS: 49 U/L (ref 38–126)
ALT: 14 U/L — ABNORMAL LOW (ref 17–63)
ANION GAP: 7 (ref 5–15)
AST: 20 U/L (ref 15–41)
Albumin: 3.9 g/dL (ref 3.5–5.0)
BILIRUBIN TOTAL: 0.4 mg/dL (ref 0.3–1.2)
BUN: 19 mg/dL (ref 6–20)
CALCIUM: 9.2 mg/dL (ref 8.9–10.3)
CO2: 30 mmol/L (ref 22–32)
Chloride: 104 mmol/L (ref 101–111)
Creatinine, Ser: 0.92 mg/dL (ref 0.61–1.24)
Glucose, Bld: 96 mg/dL (ref 65–99)
Potassium: 3.9 mmol/L (ref 3.5–5.1)
Sodium: 141 mmol/L (ref 135–145)
TOTAL PROTEIN: 6.8 g/dL (ref 6.5–8.1)

## 2016-02-07 LAB — URINE MICROSCOPIC-ADD ON
Bacteria, UA: NONE SEEN
Squamous Epithelial / LPF: NONE SEEN
WBC UA: NONE SEEN WBC/hpf (ref 0–5)

## 2016-02-07 LAB — TYPE AND SCREEN
ABO/RH(D): A POS
Antibody Screen: NEGATIVE

## 2016-02-07 LAB — APTT: aPTT: 32 seconds (ref 24–37)

## 2016-02-07 LAB — URINALYSIS, ROUTINE W REFLEX MICROSCOPIC
Bilirubin Urine: NEGATIVE
Glucose, UA: NEGATIVE mg/dL
Ketones, ur: NEGATIVE mg/dL
Leukocytes, UA: NEGATIVE
Nitrite: NEGATIVE
PH: 5.5 (ref 5.0–8.0)
Protein, ur: NEGATIVE mg/dL
SPECIFIC GRAVITY, URINE: 1.015 (ref 1.005–1.030)

## 2016-02-07 LAB — PROTIME-INR
INR: 1.1 (ref 0.00–1.49)
PROTHROMBIN TIME: 14.4 s (ref 11.6–15.2)

## 2016-02-07 LAB — SURGICAL PCR SCREEN
MRSA, PCR: NEGATIVE
Staphylococcus aureus: NEGATIVE

## 2016-02-07 LAB — CBC
HEMATOCRIT: 42.1 % (ref 39.0–52.0)
HEMOGLOBIN: 13.2 g/dL (ref 13.0–17.0)
MCH: 29.1 pg (ref 26.0–34.0)
MCHC: 31.4 g/dL (ref 30.0–36.0)
MCV: 92.9 fL (ref 78.0–100.0)
Platelets: 118 10*3/uL — ABNORMAL LOW (ref 150–400)
RBC: 4.53 MIL/uL (ref 4.22–5.81)
RDW: 14.5 % (ref 11.5–15.5)
WBC: 4.4 10*3/uL (ref 4.0–10.5)

## 2016-02-07 MED ORDER — DEXTROSE 5 % IV SOLN
1.5000 g | INTRAVENOUS | Status: AC
  Administered 2016-02-08 (×2): 1.5 g via INTRAVENOUS
  Filled 2016-02-07: qty 1.5

## 2016-02-07 NOTE — Pre-Procedure Instructions (Signed)
Devin FamJohn L Shaw  02/07/2016      [facility] - [place] - [address] [place] [id number] Phone: [phone] Fax: [phone]    Your procedure is scheduled on Tuesday, June 13th, 2017.  [facility] Admitting at 5:30 A.M.   Call this number if you have problems the morning of surgery:  [phone]   Remember:  Do not eat food or drink liquids after midnight.  Take these medicines the morning of surgery with A SIP OF WATER: Aspirin, Cetirizine (Zyrtec), Fluticasone (Flonase)  7 days prior to surgery STOP taking any Aleve, Naproxen, Ibuprofen, Motrin, Advil, Goody's, BC's, all herbal medications, fish oil, and all vitamins   Do not wear jewelry.  Do not wear lotions, powders, or colognes.  You may wear deodorant.  Men may shave face and neck.  Do not bring valuables to the hospital.   [facility] is not responsible for any belongings or valuables.  Contacts, dentures or bridgework may not be worn into surgery.  Leave your suitcase in the car.  After surgery it may be brought to your room.  For patients admitted to the hospital, discharge time will be determined by your treatment team.  Patients discharged the day of surgery will not be allowed to drive home.    [facility]- Preparing For Surgery  Before surgery, you can play an important role. Because skin is not sterile, your skin needs to be as free of germs as possible. You can reduce the number of germs on your skin by washing with CHG (chlorahexidine gluconate) Soap before surgery.  CHG is an antiseptic cleaner which kills germs and bonds with the skin to continue killing germs even after washing.  Please do not use if you have an allergy to CHG or antibacterial soaps. If your skin becomes reddened/irritated stop using the CHG.  Do not shave (including legs and underarms) for at least 48 hours prior to first CHG shower. It is OK to shave your face.  Please follow these  instructions carefully.   1. Shower the NIGHT BEFORE SURGERY and the MORNING OF SURGERY with CHG.   2. If you chose to wash your hair, wash your hair first as usual with your normal shampoo.  3. After you shampoo, rinse your hair and body thoroughly to remove the shampoo.  4. Use CHG as you would any other liquid soap. You can apply CHG directly to the skin and wash gently with a scrungie or a clean washcloth.   5. Apply the CHG Soap to your body ONLY FROM THE NECK DOWN.  Do not use on open wounds or open sores. Avoid contact with your eyes, ears, mouth and genitals (private parts). Wash genitals (private parts) with your normal soap.  6. Wash thoroughly, paying special attention to the area where your surgery will be performed.  7. Thoroughly rinse your body with warm water from the neck down.  8. DO NOT shower/wash with your normal soap after using and rinsing off the CHG Soap.  9. Pat yourself dry with a CLEAN TOWEL.   10. Wear CLEAN PAJAMAS   11. Place CLEAN SHEETS on your bed the night of your first shower and DO NOT SLEEP WITH PETS.    Day of Surgery: Do not apply any deodorants/lotions. Please wear clean clothes to the hospital/surgery center.      Please read over the following fact sheets that you were given. MRSA  Information

## 2016-02-07 NOTE — Progress Notes (Signed)
Per Judeth CornfieldStephanie at Dr. Darrick PennaFields' office, patient is to continue taking his Aspirin, including the day of surgery.

## 2016-02-07 NOTE — Progress Notes (Signed)
Anesthesia Chart Review:  Pt is an 82 year old male scheduled for repair L popliteal artery aneurysm on 02/08/2016 with Dr. Darrick PennaFields.   PMH includes:  Heart murmur, hyperlipidemia, hx asthma, GERD. Former smoker. BMI 21. S/p EVAR AAA 11/01/15.   Medications include: ASA  Preoperative labs reviewed.  CMET still pending.   1 view CXR 11/01/15: No active cardiopulmonary process  EKG 10/11/15: NSR. LAD. Pulmonary disease pattern. Voltage criteria for LVH. Nonspecific T wave abnormality.   Echo 10/14/15:  - Left ventricle: The cavity size was normal. Wall thickness was increased in a pattern of mild LVH. Systolic function was normal. The estimated ejection fraction was in the range of 50% to 55%. Possible hypokinesis of the basalinferolateral myocardium. Doppler parameters are consistent with abnormal left ventricular relaxation (grade 1 diastolic dysfunction). - Aortic valve: Trileaflet; mildly to moderately calcified leaflets. There was trivial regurgitation. - Mitral valve: Calcified annulus. There was trivial regurgitation. - Left atrium: The atrium was at the upper limits of normal in size. - Right atrium: The atrium was mildly dilated. - Tricuspid valve: There was trivial regurgitation. - Pulmonary arteries: Systolic pressure could not be accurately estimated. - Pericardium, extracardiac: There was no pericardial effusion.  Pt saw Dr. Purvis SheffieldKoneswaran with cardiology for pre-op eval for EVAR back in February and was cleared for that surgery. Tolerated EVAR without issue.   If CMET acceptable, I anticipate pt can proceed with surgery as scheduled.   Rica Mastngela Lesly Joslyn, FNP-BC [facility] Phone: [phone] 02/07/2016 4:46 PM

## 2016-02-07 NOTE — Anesthesia Preprocedure Evaluation (Deleted)
Anesthesia Evaluation  Patient identified by MRN, date of birth, ID band Patient awake    Reviewed: Allergy & Precautions, H&P , NPO status , Patient's Chart, lab work & pertinent test results  Airway        Dental no notable dental hx.    Pulmonary neg pulmonary ROS, former smoker,    Pulmonary exam normal        Cardiovascular + Peripheral Vascular Disease  negative cardio ROS       Neuro/Psych Anxiety negative neurological ROS  negative psych ROS   GI/Hepatic negative GI ROS, Neg liver ROS,   Endo/Other  negative endocrine ROS  Renal/GU negative Renal ROS  negative genitourinary   Musculoskeletal  (+) Arthritis , Osteoarthritis,    Abdominal   Peds  Hematology negative hematology ROS (+)   Anesthesia Other Findings   Reproductive/Obstetrics negative OB ROS                            Anesthesia Physical Anesthesia Plan  ASA: III  Anesthesia Plan: General   Post-op Pain Management:    Induction: Intravenous  Airway Management Planned: Oral ETT  Additional Equipment:   Intra-op Plan:   Post-operative Plan: Extubation in OR  Informed Consent: I have reviewed the patients History and Physical, chart, labs and discussed the procedure including the risks, benefits and alternatives for the proposed anesthesia with the patient or authorized representative who has indicated his/her understanding and acceptance.   Dental advisory given  Plan Discussed with: CRNA  Anesthesia Plan Comments:         Anesthesia Quick Evaluation

## 2016-02-07 NOTE — Progress Notes (Signed)
PCP: Maryan PulsShish Shah Cardiologist: pt has no current cardiologist but saw Dr. Darl HouseholderKoneswaren prior to AAA repair in February Echo: 10/14/15 EKG: 10/11/15  Pt with no complaints of shortness of breath, fever or chest pain.

## 2016-02-08 ENCOUNTER — Inpatient Hospital Stay (HOSPITAL_COMMUNITY)
Admission: RE | Admit: 2016-02-08 | Discharge: 2016-02-12 | DRG: 253 | Disposition: A | Discharge status: Home Health | Payer: Medicare Other | Source: Ambulatory Visit | Attending: Vascular Surgery | Admitting: Vascular Surgery

## 2016-02-08 ENCOUNTER — Inpatient Hospital Stay (HOSPITAL_COMMUNITY): Payer: Medicare Other | Admitting: Certified Registered Nurse Anesthetist

## 2016-02-08 ENCOUNTER — Encounter (HOSPITAL_COMMUNITY)
Admission: RE | Disposition: A | Discharge status: Home Health | Payer: Self-pay | Source: Ambulatory Visit | Attending: Vascular Surgery

## 2016-02-08 ENCOUNTER — Encounter (HOSPITAL_COMMUNITY): Payer: Self-pay | Admitting: General Practice

## 2016-02-08 ENCOUNTER — Inpatient Hospital Stay (HOSPITAL_COMMUNITY): Payer: Medicare Other | Admitting: Emergency Medicine

## 2016-02-08 DIAGNOSIS — E785 Hyperlipidemia, unspecified: Secondary | ICD-10-CM | POA: Diagnosis present

## 2016-02-08 DIAGNOSIS — F419 Anxiety disorder, unspecified: Secondary | ICD-10-CM | POA: Diagnosis present

## 2016-02-08 DIAGNOSIS — M1990 Unspecified osteoarthritis, unspecified site: Secondary | ICD-10-CM | POA: Diagnosis present

## 2016-02-08 DIAGNOSIS — Z7951 Long term (current) use of inhaled steroids: Secondary | ICD-10-CM | POA: Diagnosis not present

## 2016-02-08 DIAGNOSIS — Z7982 Long term (current) use of aspirin: Secondary | ICD-10-CM

## 2016-02-08 DIAGNOSIS — Z8679 Personal history of other diseases of the circulatory system: Secondary | ICD-10-CM | POA: Diagnosis not present

## 2016-02-08 DIAGNOSIS — D62 Acute posthemorrhagic anemia: Secondary | ICD-10-CM | POA: Diagnosis not present

## 2016-02-08 DIAGNOSIS — K219 Gastro-esophageal reflux disease without esophagitis: Secondary | ICD-10-CM | POA: Diagnosis present

## 2016-02-08 DIAGNOSIS — I724 Aneurysm of artery of lower extremity: Principal | ICD-10-CM | POA: Diagnosis present

## 2016-02-08 HISTORY — PX: FALSE ANEURYSM REPAIR: SHX5152

## 2016-02-08 HISTORY — PX: BYPASS GRAFT POPLITEAL TO POPLITEAL: SHX5763

## 2016-02-08 HISTORY — PX: VEIN HARVEST: SHX6363

## 2016-02-08 HISTORY — DX: Unspecified osteoarthritis, unspecified site: M19.90

## 2016-02-08 SURGERY — REPAIR, PSEUDOANEURYSM
Anesthesia: General | Laterality: Left

## 2016-02-08 MED ORDER — EPHEDRINE 5 MG/ML INJ
INTRAVENOUS | Status: AC
  Filled 2016-02-08: qty 10

## 2016-02-08 MED ORDER — PROPOFOL 10 MG/ML IV BOLUS
INTRAVENOUS | Status: DC | PRN
  Administered 2016-02-08: 120 mg via INTRAVENOUS

## 2016-02-08 MED ORDER — POTASSIUM CHLORIDE CRYS ER 20 MEQ PO TBCR: 20.0000 meq | EXTENDED_RELEASE_TABLET | Freq: Every day | ORAL | Status: DC | PRN

## 2016-02-08 MED ORDER — PSYLLIUM 95 % PO PACK
1.0000 | PACK | Freq: Two times a day (BID) | ORAL | Status: DC
  Administered 2016-02-09 – 2016-02-11 (×5): 1 via ORAL
  Filled 2016-02-08 (×9): qty 1

## 2016-02-08 MED ORDER — ENOXAPARIN SODIUM 30 MG/0.3ML ~~LOC~~ SOLN
30.0000 mg | SUBCUTANEOUS | Status: DC
  Administered 2016-02-09 – 2016-02-12 (×3): 30 mg via SUBCUTANEOUS
  Filled 2016-02-08 (×3): qty 0.3

## 2016-02-08 MED ORDER — FENTANYL CITRATE (PF) 250 MCG/5ML IJ SOLN
INTRAMUSCULAR | Status: DC | PRN
  Administered 2016-02-08: 50 ug via INTRAVENOUS
  Administered 2016-02-08: 100 ug via INTRAVENOUS
  Administered 2016-02-08 (×3): 50 ug via INTRAVENOUS

## 2016-02-08 MED ORDER — LACTATED RINGERS IV SOLN
INTRAVENOUS | Status: DC | PRN
  Administered 2016-02-08 (×3): via INTRAVENOUS

## 2016-02-08 MED ORDER — ONDANSETRON HCL 4 MG/2ML IJ SOLN
INTRAMUSCULAR | Status: DC | PRN
  Administered 2016-02-08: 4 mg via INTRAVENOUS

## 2016-02-08 MED ORDER — ONDANSETRON HCL 4 MG/2ML IJ SOLN: 4.0000 mg | Freq: Four times a day (QID) | INTRAMUSCULAR | Status: DC | PRN

## 2016-02-08 MED ORDER — ACETAMINOPHEN 325 MG PO TABS
325.0000 mg | ORAL_TABLET | ORAL | Status: DC | PRN
  Administered 2016-02-09: 650 mg via ORAL
  Filled 2016-02-08: qty 2

## 2016-02-08 MED ORDER — LIDOCAINE 2% (20 MG/ML) 5 ML SYRINGE
INTRAMUSCULAR | Status: AC
  Filled 2016-02-08: qty 5

## 2016-02-08 MED ORDER — LIDOCAINE HCL (CARDIAC) 20 MG/ML IV SOLN
INTRAVENOUS | Status: DC | PRN
  Administered 2016-02-08: 40 mg via INTRAVENOUS

## 2016-02-08 MED ORDER — FLUTICASONE PROPIONATE 50 MCG/ACT NA SUSP
1.0000 | Freq: Every day | NASAL | Status: DC
  Administered 2016-02-10 – 2016-02-12 (×3): 1 via NASAL
  Filled 2016-02-08: qty 16

## 2016-02-08 MED ORDER — DOCUSATE SODIUM 100 MG PO CAPS
100.0000 mg | ORAL_CAPSULE | Freq: Every day | ORAL | Status: DC
  Administered 2016-02-09 – 2016-02-12 (×3): 100 mg via ORAL
  Filled 2016-02-08 (×5): qty 1

## 2016-02-08 MED ORDER — PROPOFOL 10 MG/ML IV BOLUS
INTRAVENOUS | Status: AC
  Filled 2016-02-08: qty 20

## 2016-02-08 MED ORDER — SODIUM CHLORIDE 0.9 % IV SOLN
INTRAVENOUS | Status: DC
  Administered 2016-02-08: 17:00:00 via INTRAVENOUS

## 2016-02-08 MED ORDER — EPHEDRINE SULFATE 50 MG/ML IJ SOLN
INTRAMUSCULAR | Status: DC | PRN
  Administered 2016-02-08 (×2): 10 mg via INTRAVENOUS
  Administered 2016-02-08 (×2): 5 mg via INTRAVENOUS
  Administered 2016-02-08: 10 mg via INTRAVENOUS

## 2016-02-08 MED ORDER — GUAIFENESIN-DM 100-10 MG/5ML PO SYRP: 15.0000 mL | ORAL_SOLUTION | ORAL | Status: DC | PRN

## 2016-02-08 MED ORDER — HEPARIN SODIUM (PORCINE) 5000 UNIT/ML IJ SOLN
INTRAMUSCULAR | Status: DC | PRN
  Administered 2016-02-08: 08:00:00

## 2016-02-08 MED ORDER — CHLORHEXIDINE GLUCONATE CLOTH 2 % EX PADS: 6.0000 | MEDICATED_PAD | Freq: Once | CUTANEOUS | Status: DC

## 2016-02-08 MED ORDER — FENTANYL CITRATE (PF) 250 MCG/5ML IJ SOLN
INTRAMUSCULAR | Status: AC
  Filled 2016-02-08: qty 5

## 2016-02-08 MED ORDER — SODIUM CHLORIDE 0.9 % IV SOLN: 500.0000 mL | Freq: Once | INTRAVENOUS | Status: DC | PRN

## 2016-02-08 MED ORDER — ALUM & MAG HYDROXIDE-SIMETH 200-200-20 MG/5ML PO SUSP: 15.0000 mL | ORAL | Status: DC | PRN

## 2016-02-08 MED ORDER — TRAMADOL HCL 50 MG PO TABS
50.0000 mg | ORAL_TABLET | Freq: Three times a day (TID) | ORAL | Status: DC | PRN
  Administered 2016-02-08 – 2016-02-11 (×6): 50 mg via ORAL
  Filled 2016-02-08 (×7): qty 1

## 2016-02-08 MED ORDER — LORATADINE 10 MG PO TABS
10.0000 mg | ORAL_TABLET | Freq: Every day | ORAL | Status: DC
  Administered 2016-02-08 – 2016-02-12 (×5): 10 mg via ORAL
  Filled 2016-02-08 (×5): qty 1

## 2016-02-08 MED ORDER — MAGNESIUM CITRATE PO SOLN
1.0000 | Freq: Once | ORAL | Status: AC | PRN
  Administered 2016-02-11: 1 via ORAL
  Filled 2016-02-08 (×2): qty 296

## 2016-02-08 MED ORDER — PHENYLEPHRINE HCL 10 MG/ML IJ SOLN
INTRAMUSCULAR | Status: DC | PRN
  Administered 2016-02-08: 40 ug via INTRAVENOUS
  Administered 2016-02-08 (×2): 80 ug via INTRAVENOUS
  Administered 2016-02-08: 40 ug via INTRAVENOUS
  Administered 2016-02-08 (×2): 80 ug via INTRAVENOUS
  Administered 2016-02-08: 40 ug via INTRAVENOUS

## 2016-02-08 MED ORDER — ACETAMINOPHEN 325 MG RE SUPP: 325.0000 mg | RECTAL | Status: DC | PRN

## 2016-02-08 MED ORDER — PROTAMINE SULFATE 10 MG/ML IV SOLN
INTRAVENOUS | Status: DC | PRN
  Administered 2016-02-08: 50 mg via INTRAVENOUS

## 2016-02-08 MED ORDER — ASPIRIN EC 81 MG PO TBEC
81.0000 mg | DELAYED_RELEASE_TABLET | Freq: Every day | ORAL | Status: DC
Start: 2016-02-09 — End: 2016-02-12
  Administered 2016-02-09 – 2016-02-12 (×4): 81 mg via ORAL
  Filled 2016-02-08 (×4): qty 1

## 2016-02-08 MED ORDER — LABETALOL HCL 5 MG/ML IV SOLN: 10.0000 mg | INTRAVENOUS | Status: DC | PRN

## 2016-02-08 MED ORDER — HEPARIN SODIUM (PORCINE) 1000 UNIT/ML IJ SOLN
INTRAMUSCULAR | Status: AC
  Filled 2016-02-08: qty 1

## 2016-02-08 MED ORDER — DEXTROSE 5 % IV SOLN
1.5000 g | Freq: Two times a day (BID) | INTRAVENOUS | Status: AC
  Administered 2016-02-08 – 2016-02-09 (×2): 1.5 g via INTRAVENOUS
  Filled 2016-02-08 (×2): qty 1.5

## 2016-02-08 MED ORDER — POLYETHYLENE GLYCOL 3350 17 G PO PACK: 17.0000 g | PACK | Freq: Every day | ORAL | Status: DC | PRN

## 2016-02-08 MED ORDER — PANTOPRAZOLE SODIUM 40 MG PO TBEC
40.0000 mg | DELAYED_RELEASE_TABLET | Freq: Every day | ORAL | Status: DC
Start: 2016-02-08 — End: 2016-02-12
  Administered 2016-02-08 – 2016-02-12 (×5): 40 mg via ORAL
  Filled 2016-02-08 (×5): qty 1

## 2016-02-08 MED ORDER — SUGAMMADEX SODIUM 200 MG/2ML IV SOLN
INTRAVENOUS | Status: DC | PRN
  Administered 2016-02-08: 150 mg via INTRAVENOUS

## 2016-02-08 MED ORDER — BISACODYL 10 MG RE SUPP: 10.0000 mg | Freq: Every day | RECTAL | Status: DC | PRN

## 2016-02-08 MED ORDER — PHENYLEPHRINE 40 MCG/ML (10ML) SYRINGE FOR IV PUSH (FOR BLOOD PRESSURE SUPPORT)
PREFILLED_SYRINGE | INTRAVENOUS | Status: AC
  Filled 2016-02-08: qty 10

## 2016-02-08 MED ORDER — SODIUM CHLORIDE 0.9 % IV SOLN: INTRAVENOUS | Status: DC

## 2016-02-08 MED ORDER — CHLORDIAZEPOXIDE HCL 5 MG PO CAPS
10.0000 mg | ORAL_CAPSULE | Freq: Every evening | ORAL | Status: DC | PRN
  Administered 2016-02-08 – 2016-02-11 (×2): 10 mg via ORAL
  Filled 2016-02-08 (×3): qty 2

## 2016-02-08 MED ORDER — MIDAZOLAM HCL 2 MG/2ML IJ SOLN
INTRAMUSCULAR | Status: AC
  Filled 2016-02-08: qty 2

## 2016-02-08 MED ORDER — MORPHINE SULFATE (PF) 2 MG/ML IV SOLN
1.0000 mg | INTRAVENOUS | Status: DC | PRN
  Administered 2016-02-08: 2 mg via INTRAVENOUS
  Filled 2016-02-08: qty 1

## 2016-02-08 MED ORDER — PHENYLEPHRINE HCL 10 MG/ML IJ SOLN
10.0000 mg | INTRAVENOUS | Status: DC | PRN
  Administered 2016-02-08: 20 ug/min via INTRAVENOUS

## 2016-02-08 MED ORDER — 0.9 % SODIUM CHLORIDE (POUR BTL) OPTIME
TOPICAL | Status: DC | PRN
  Administered 2016-02-08: 2000 mL

## 2016-02-08 MED ORDER — MIDAZOLAM HCL 5 MG/5ML IJ SOLN
INTRAMUSCULAR | Status: DC | PRN
  Administered 2016-02-08: 1 mg via INTRAVENOUS

## 2016-02-08 MED ORDER — ROCURONIUM BROMIDE 100 MG/10ML IV SOLN
INTRAVENOUS | Status: DC | PRN
  Administered 2016-02-08: 50 mg via INTRAVENOUS

## 2016-02-08 MED ORDER — METOPROLOL TARTRATE 5 MG/5ML IV SOLN: 2.0000 mg | INTRAVENOUS | Status: DC | PRN

## 2016-02-08 MED ORDER — DEXTROSE 5 % IV SOLN
1.5000 g | INTRAVENOUS | Status: DC
  Filled 2016-02-08: qty 1.5

## 2016-02-08 MED ORDER — HEPARIN SODIUM (PORCINE) 1000 UNIT/ML IJ SOLN
INTRAMUSCULAR | Status: DC | PRN
  Administered 2016-02-08: 2000 [IU] via INTRAVENOUS
  Administered 2016-02-08: 8000 [IU] via INTRAVENOUS

## 2016-02-08 MED ORDER — HYDRALAZINE HCL 20 MG/ML IJ SOLN: 5.0000 mg | INTRAMUSCULAR | Status: DC | PRN

## 2016-02-08 MED ORDER — PHENOL 1.4 % MT LIQD: 1.0000 | OROMUCOSAL | Status: DC | PRN

## 2016-02-08 MED ORDER — ROCURONIUM BROMIDE 50 MG/5ML IV SOLN
INTRAVENOUS | Status: AC
  Filled 2016-02-08: qty 1

## 2016-02-08 SURGICAL SUPPLY — 57 items
BANDAGE ESMARK 6X9 LF (GAUZE/BANDAGES/DRESSINGS) IMPLANT
BNDG ESMARK 6X9 LF (GAUZE/BANDAGES/DRESSINGS)
CANISTER SUCTION 2500CC (MISCELLANEOUS) ×2 IMPLANT
CANNULA VESSEL 3MM 2 BLNT TIP (CANNULA) ×4 IMPLANT
CLIP TI MEDIUM 24 (CLIP) ×2 IMPLANT
CLIP TI WIDE RED SMALL 24 (CLIP) ×4 IMPLANT
COVER PROBE W GEL 5X96 (DRAPES) ×2 IMPLANT
CUFF TOURNIQUET SINGLE 18IN (TOURNIQUET CUFF) IMPLANT
CUFF TOURNIQUET SINGLE 24IN (TOURNIQUET CUFF) IMPLANT
CUFF TOURNIQUET SINGLE 34IN LL (TOURNIQUET CUFF) IMPLANT
CUFF TOURNIQUET SINGLE 44IN (TOURNIQUET CUFF) IMPLANT
DRAIN SNY 10X20 3/4 PERF (WOUND CARE) IMPLANT
DRAPE PROXIMA HALF (DRAPES) ×2 IMPLANT
DRSG COVADERM 4X8 (GAUZE/BANDAGES/DRESSINGS) IMPLANT
ELECT REM PT RETURN 9FT ADLT (ELECTROSURGICAL) ×2
ELECTRODE REM PT RTRN 9FT ADLT (ELECTROSURGICAL) ×1 IMPLANT
EVACUATOR SILICONE 100CC (DRAIN) IMPLANT
GAUZE SPONGE 4X4 16PLY XRAY LF (GAUZE/BANDAGES/DRESSINGS) ×2 IMPLANT
GLOVE BIO SURGEON STRL SZ 6.5 (GLOVE) ×4 IMPLANT
GLOVE BIO SURGEON STRL SZ7.5 (GLOVE) ×6 IMPLANT
GLOVE BIOGEL PI IND STRL 7.0 (GLOVE) ×7 IMPLANT
GLOVE BIOGEL PI IND STRL 8 (GLOVE) ×3 IMPLANT
GLOVE BIOGEL PI INDICATOR 7.0 (GLOVE) ×7
GLOVE BIOGEL PI INDICATOR 8 (GLOVE) ×3
GLOVE SURG SS PI 6.5 STRL IVOR (GLOVE) ×6 IMPLANT
GLOVE SURG SS PI 7.0 STRL IVOR (GLOVE) ×4 IMPLANT
GOWN STRL REUS W/ TWL LRG LVL3 (GOWN DISPOSABLE) ×5 IMPLANT
GOWN STRL REUS W/ TWL XL LVL3 (GOWN DISPOSABLE) ×4 IMPLANT
GOWN STRL REUS W/TWL LRG LVL3 (GOWN DISPOSABLE) ×5
GOWN STRL REUS W/TWL XL LVL3 (GOWN DISPOSABLE) ×4
KIT BASIN OR (CUSTOM PROCEDURE TRAY) ×2 IMPLANT
KIT ROOM TURNOVER OR (KITS) ×2 IMPLANT
LIQUID BAND (GAUZE/BANDAGES/DRESSINGS) ×4 IMPLANT
NS IRRIG 1000ML POUR BTL (IV SOLUTION) ×4 IMPLANT
PACK PERIPHERAL VASCULAR (CUSTOM PROCEDURE TRAY) ×2 IMPLANT
PAD ARMBOARD 7.5X6 YLW CONV (MISCELLANEOUS) ×4 IMPLANT
SPONGE LAP 18X18 X RAY DECT (DISPOSABLE) ×2 IMPLANT
SPONGE SURGIFOAM ABS GEL 100 (HEMOSTASIS) IMPLANT
STAPLER VISISTAT 35W (STAPLE) IMPLANT
SUT BONE WAX W31G (SUTURE) ×2 IMPLANT
SUT PROLENE 5 0 C 1 24 (SUTURE) ×10 IMPLANT
SUT PROLENE 5 0 C 1 36 (SUTURE) ×2 IMPLANT
SUT PROLENE 6 0 CC (SUTURE) ×8 IMPLANT
SUT PROLENE 7 0 BV 1 (SUTURE) ×4 IMPLANT
SUT SILK 0 TIES 10X30 (SUTURE) ×2 IMPLANT
SUT SILK 2 0 FS (SUTURE) ×2 IMPLANT
SUT SILK 3 0 (SUTURE) ×2
SUT SILK 3-0 18XBRD TIE 12 (SUTURE) ×2 IMPLANT
SUT VIC AB 2-0 CTX 36 (SUTURE) IMPLANT
SUT VIC AB 3-0 SH 27 (SUTURE) ×5
SUT VIC AB 3-0 SH 27X BRD (SUTURE) ×5 IMPLANT
SUT VICRYL 4-0 PS2 18IN ABS (SUTURE) ×6 IMPLANT
TAPE STRIPS DRAPE STRL (GAUZE/BANDAGES/DRESSINGS) ×2 IMPLANT
TAPE UMBILICAL COTTON 1/8X30 (MISCELLANEOUS) ×2 IMPLANT
TRAY FOLEY W/METER SILVER 16FR (SET/KITS/TRAYS/PACK) ×2 IMPLANT
UNDERPAD 30X30 INCONTINENT (UNDERPADS AND DIAPERS) ×2 IMPLANT
WATER STERILE IRR 1000ML POUR (IV SOLUTION) ×2 IMPLANT

## 2016-02-08 NOTE — Progress Notes (Signed)
  [facility] Day [facility] Note  Subjective:  Soreness with incisions.   Filed Vitals:   02/08/16 1500 02/08/16 1505  BP:  129/84  Pulse: 77 78  Temp:    Resp: 14 11    Left leg incisions clean and intact. No hematoma. Palpable left PT pulse  Assessment/Plan:  This is a 82 y.o. male who is s/p repair of left popliteal artery aneurysm  Stable post-op To 3S soon.    Maris BergerKimberly Sharra Cayabyab, [place]-C Pager: [phone] 02/08/2016 3:36 PM

## 2016-02-08 NOTE — Anesthesia Preprocedure Evaluation (Deleted)
Anesthesia Evaluation    Airway        Dental   Pulmonary former smoker,          Cardiovascular     Neuro/Psych    GI/Hepatic   Endo/Other    Renal/GU      Musculoskeletal   Abdominal   Peds  Hematology   Anesthesia Other Findings   Reproductive/Obstetrics                             Anesthesia Physical Anesthesia Plan Anesthesia Quick Evaluation  

## 2016-02-08 NOTE — Anesthesia Postprocedure Evaluation (Signed)
Anesthesia Post Note  Patient: Devin FamJohn L Shaw  Procedure(s) Performed: Procedure(s) (LRB): LEFT ABOVE KNEE TO BELOW KNEE  POPLITEAL ARTERY BYPASS GRAFT USING LEFT REVERSED GREATER SAPHENOUS VEIN WITH LIGATION OF LEFT POPLITEAL ARTERY ANEURYSM (Left) LEFT GREATER SAPHENOUS VEIN HARVEST (Left)  Patient location during evaluation: PACU Anesthesia Type: General Level of consciousness: awake, oriented and awake and alert Pain management: pain level controlled Vital Signs Assessment: post-procedure vital signs reviewed and stable Respiratory status: spontaneous breathing, respiratory function stable and nonlabored ventilation Cardiovascular status: blood pressure returned to baseline Anesthetic complications: no    Last Vitals:  Filed Vitals:   02/08/16 1550 02/08/16 1605  BP: 124/96 151/77  Pulse: 79 75  Temp:    Resp: 12 12    Last Pain:  Filed Vitals:   02/08/16 1622  PainSc: 2                  Aidyn Kellis COKER

## 2016-02-08 NOTE — H&P (View-Only) (Signed)
Patient is an 82 year old male who returns for postoperative follow-up today. 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 This was uneventful. He returns today reporting no drainage from his right groin incision. He has essentially return to normal activities. However he currently is only walking 1 mile every day as opposed to the 3-1/2 miles he was walking every day preoperatively. He states he is still recovering overall. He denies any pain in his feet. He denies any abdominal or back pain.    Past Medical History   Diagnosis  Date   .  AAA (abdominal aortic aneurysm) (HCC)     .  GERD (gastroesophageal reflux disease)     .  Osteoarthritis     .  Anxiety     .  Varicose veins     .  Hyperglycemia     .  Hyperlipidemia     .  Vertigo     .  Popliteal fullness         has multiple aneurysms behind his left knee   .  H/O seasonal allergies         ragweed   .  Dry skin         Past Surgical History   Procedure  Laterality  Date   .  Varicose vein surgery       .  Nasal sinus surgery       .  Hernia repair  Bilateral     .  Abdominal aortic endovascular stent graft  N/A  11/01/2015       Procedure: ABDOMINAL AORTIC ENDOVASCULAR STENT GRAFT WITH REPAIR OF RIGHT COMMON FEMORAL ARTERY ANEURYSM;  Surgeon: Sherren Kerns, MD;  Location: [facility] OR;  Service: Vascular;  Laterality: N/A;       Current Outpatient Prescriptions on File Prior to Visit   Medication  Sig  Dispense  Refill   .  acetaminophen (TYLENOL) 650 MG CR tablet  Take 650 mg by mouth every 8 (eight) hours as needed for pain.       Marland Kitchen  aspirin EC 81 MG tablet  Take 1 tablet (81 mg total) by mouth daily.  30 tablet  0   .  cetirizine (ZYRTEC) 10 MG tablet  Take 10 mg by mouth daily.       .  chlordiazePOXIDE (LIBRIUM) 5 MG capsule  Take 5 mg by mouth at bedtime as needed for anxiety.        .  fluticasone (FLONASE) 50  MCG/ACT nasal spray  Place 1 spray into both nostrils daily.        .  metoprolol succinate (TOPROL-XL) 25 MG 24 hr tablet  Take 12.5 mg by mouth daily.        Marland Kitchen  oxyCODONE-acetaminophen (PERCOCET/ROXICET) 5-325 MG tablet  Take 1 tablet by mouth every 6 (six) hours as needed for moderate pain. (Patient not taking: Reported on 12/02/2015)  20 tablet  0      No current facility-administered medications on file prior to visit.     Physical exam:     Filed Vitals:   02/03/16 1318  BP: 140/88  Pulse: 60  Temp: 97 F (36.1 C)  TempSrc: Oral  Resp: 16  Height:  (1.905 m)  Weight: 164 lb (74.39 kg)  SpO2: 94%   Chest: Clear to auscultation bilaterally.  Cardiac: Regular rate and rhythm without murmur Extremities:  Right groin incision well-healed feet pink warm bilaterally, 3+ left popliteal pulse 2+ posterior tibial pulses bilaterally Abdomen: Soft nontender nondistended 2+ femoral pulses  Data: Patient had a CT angiogram performed 6/1 and I reviewed these images today. This shows the stent graft is in good position with no evidence of type I or type II endoleak no evidence of migration top and the stent graft is just below the renal arteries. Right common femoral graft is widely patent. 3 cm left popliteal aneurysm 2 similar right popliteal aneurysm posterior tibial peroneal runoff bilaterally 2.3 cm left common femoral artery aneurysm  Assessment: Doing well status post right common femoral aneurysm and infrarenal abdominal aortic aneurysm repair. Patient has a known 3 cm left popliteal aneurysm and a known 2 cm left common femoral artery aneurysm as well as a smaller popliteal artery aneurysm on the right. The popliteal and common femoral artery aneurysm on the left side are of the size to consider repair. However, the patient states he needs to recover further before considering any other operations.   At this time he wishes to proceed with repair of the left popliteal aneurysm. We will  schedule this for Tuesday, June 13. Risk benefits possible, the patient's procedure details were discussed the patient today including but limited to bleeding infection graft thrombosis potential limb loss he understands and agrees to proceed.  Fabienne Brunsharles Devan Babino, MD [facility] [place] Office: [phone] Pager: [phone]

## 2016-02-08 NOTE — Op Note (Signed)
Procedure: Left above-knee popliteal to below knee popliteal bypass,,ipsilateral reversed greater saphenous vein, ultrasound vein mapping Preoperative diagnosis: Left popliteal aneurysm Postoperative diagnosis: Same  Anesthesia: General  Asst.: Doreatha Massed, PA-C  Operative findings:  #1 3 mm left greater saphenous vein reversed end to side proximal and distal     Operative details: After obtaining informed consent, the patient was taken to the operating room. The patient was placed in supine position on the operating room table. After induction of general anesthesia and endotracheal intubation, a Foley catheter was placed. Next, the patient's entire left lower extremity was prepped and draped in the usual sterile fashion. Ultrasound was used to identify the greater saphenous vein.  A longitudinal incision was then made on the medial aspect of the left leg.  The greater saphenous vein was dissected free circumferentially and side branches ligated and divided between silk ties.  The vein was approximately 3 mm in diameter but quickly branched an became quite small in the mid leg.  A skin bridge was made at the knee joint and an additional longitudinal incision made over the vein on the medial aspect of the leg in the lower thigh.  The vein was dissected in similar fashion and was of similar quality.  I then made one additional incision above this and also dissected out the vein for adequate length.  The vein was ligated proximally and distally and set aside in heparinized saline after inspecting it.  The above knee popliteal artery was then dissected free by reflecting the sartorius posteriorly.  The artery was pulsatile and was 3-4 cm diameter in the entire above knee segment.  I was able to get to a neck on the aneurysm at the adductor hiatus.  The artery was about 10mm in diameter here but non aneurysmal.  Dissection was fairly tedious due to several adherent vein and geniculate branches.  The popliteal  vein was protected.  The below knee popliteal incision was then deepened through the fascia to expose the below knee popliteal vessel which was of good quality and also pulsatile.  A vessel loop was placed around this.  It was about 8 mm diameter.  A tunnel was then created from the below knee to above knee space between the heads of the gastrocnemius muscle and an umbilical tape placed in the tunnel.     The patient was given 8000 units of heparin and an additional 3000 units of heparin during the case.    After appropriate circulation time of the heparin, the proximal left popliteal artery was controlled with a henley clamp at the adductor hiatus.  Due to the large size of the aneurysm I wanted to completely get this out of the way so the graft would lay appropriately.  Therefore it was clamped just above the knee joint at an area where it tapered back to 10 mm.  The aneurysm was opened longitudinally and geniculates ligated from the inside with 2 0 silk sutures.  The distal end was transected and oversewn with a 5 0 prolene.  I removed most of the aneurym oversewing a  Small cuff proximally with a 5 0 prolene.  I then made a londitudinal opening in the popliteal artery just below the adductor hiatus above the aneurysm origin. The vein was reversed.   The vein was then spatulated and sewn end of vein to side of artery using a running 6-0 Prolene suture. At completion the henley clamp was released and there was good pulsatile flow through the  graft. The graft was marked for orientation.   The graft was then brought through the tunnel down to the below-knee popliteal artery. The below-knee popliteal artery was controlled proximally with a Henley clamp and distally with a henley clamp. A longitudinal opening was made in the distal below-knee popliteal artery in an area that was fairly free of calcification. The graft was then cut to length and spatulated and sewn end of graft to side of artery using running 6-0  Prolene suture. At completion of the anastomosis everything was forebled backbled and thoroughly flushed. The remainder of the anastomosis was completed and all clamps were removed restoring pulsatile flow to the below-knee popliteal artery. There was a palpable PT pulse and good doppler signal in the graft and posterior tibial artery.  The popliteal artery above the distal anastomosis was then ligated with a 0 silk tie.   The patient had biphasic Doppler flow in the posterior area of the foot. This augmented approximately 100% with unclamping the graft.  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   The patient was given 50 mg of protamine.  After hemostasis was obtained, the deep layers and subcutaneous layers of the above-knee popliteal incision were closed with running 3-0 Vicryl suture. The skin was closed with a 4-0 Vicryl subcuticular stitch. The below knee incision was inspected and found to be hemostatic. This was then closed in multiple layers of running 3-0 Vicryl suture and 4-0 subcuticular stitch. The patient tolerated the procedure well and there were no complications. Instrument sponge and needle counts were correct at the end of the case. Patient was taken to the recovery in stable condition.   Fabienne Brunsharles Fields, MD  [facility] [place]  Office: [phone]  Pager: [phone]

## 2016-02-08 NOTE — Anesthesia Preprocedure Evaluation (Signed)
Anesthesia Evaluation  Patient identified by MRN, date of birth, ID band Patient awake    Reviewed: Allergy & Precautions, NPO status , Patient's Chart, lab work & pertinent test results  Airway Mallampati: II  TM Distance: >3 FB Neck ROM: Full    Dental  (+) Teeth Intact, Dental Advisory Given   Pulmonary former smoker,    breath sounds clear to auscultation       Cardiovascular  Rhythm:Regular Rate:Normal     Neuro/Psych    GI/Hepatic   Endo/Other    Renal/GU      Musculoskeletal   Abdominal   Peds  Hematology   Anesthesia Other Findings   Reproductive/Obstetrics                            Anesthesia Physical Anesthesia Plan  ASA: III  Anesthesia Plan: General   Post-op Pain Management:    Induction: Intravenous  Airway Management Planned: Oral ETT  Additional Equipment:   Intra-op Plan:   Post-operative Plan: Extubation in OR  Informed Consent: I have reviewed the patients History and Physical, chart, labs and discussed the procedure including the risks, benefits and alternatives for the proposed anesthesia with the patient or authorized representative who has indicated his/her understanding and acceptance.   Dental advisory given  Plan Discussed with: CRNA and Anesthesiologist  Anesthesia Plan Comments:        Anesthesia Quick Evaluation  

## 2016-02-08 NOTE — Transfer of Care (Signed)
Immediate Anesthesia Transfer of Care Note  Patient: Devin Shaw  Procedure(s) Performed: Procedure(s): LEFT ABOVE KNEE TO BELOW KNEE  POPLITEAL ARTERY BYPASS GRAFT USING LEFT REVERSED GREATER SAPHENOUS VEIN WITH LIGATION OF LEFT POPLITEAL ARTERY ANEURYSM (Left) LEFT GREATER SAPHENOUS VEIN HARVEST (Left)  Patient Location: PACU  Anesthesia Type:General  Level of Consciousness: sedated and responds to stimulation  Airway & Oxygen Therapy: Patient connected to nasal cannula oxygen  Post-op Assessment: Report given to RN  Post vital signs: stable  Last Vitals:  Filed Vitals:   02/08/16 0609  BP: 211/97  Pulse: 55  Temp: 36.4 C  Resp: 20    Last Pain: There were no vitals filed for this visit.    Patients Stated Pain Goal: 3 (02/08/16 0604)  Complications: No apparent anesthesia complications

## 2016-02-08 NOTE — Anesthesia Procedure Notes (Signed)
Procedure Name: Intubation Date/Time: 02/08/2016 7:41 AM Performed by: Tillman AbideHAWKINS, Yasmyn Bellisario B Pre-anesthesia Checklist: Patient identified, Emergency Drugs available, Suction available and Patient being monitored Patient Re-evaluated:Patient Re-evaluated prior to inductionOxygen Delivery Method: Circle System Utilized Preoxygenation: Pre-oxygenation with 100% oxygen Intubation Type: IV induction Ventilation: Mask ventilation without difficulty Laryngoscope Size: Mac and 3 Grade View: Grade II Tube type: Oral Tube size: 8.0 mm Number of attempts: 1 Airway Equipment and Method: Stylet Placement Confirmation: ETT inserted through vocal cords under direct vision,  positive ETCO2 and breath sounds checked- equal and bilateral Secured at: 23 cm Tube secured with: Tape Dental Injury: Teeth and Oropharynx as per pre-operative assessment

## 2016-02-08 NOTE — Interval H&P Note (Signed)
History and Physical Interval Note:  02/08/2016 7:26 AM  Devin Shaw  has presented today for surgery, with the diagnosis of Left popliteal artery aneurysm I72.4  The various methods of treatment have been discussed with the patient and family. After consideration of risks, benefits and other options for treatment, the patient has consented to  Procedure(s): REPAIR POPLITEAL ARTERY ANEURYSM (Left) as a surgical intervention .  The patient's history has been reviewed, patient examined, no change in status, stable for surgery.  I have reviewed the patient's chart and labs.  Questions were answered to the patient's satisfaction.     Fabienne BrunsFields, Charles

## 2016-02-09 ENCOUNTER — Encounter (HOSPITAL_COMMUNITY): Payer: Self-pay | Admitting: Vascular Surgery

## 2016-02-09 ENCOUNTER — Ambulatory Visit (HOSPITAL_COMMUNITY): Payer: Medicare Other

## 2016-02-09 LAB — BASIC METABOLIC PANEL
Anion gap: 5 (ref 5–15)
BUN: 12 mg/dL (ref 6–20)
CALCIUM: 8.1 mg/dL — AB (ref 8.9–10.3)
CO2: 31 mmol/L (ref 22–32)
Chloride: 103 mmol/L (ref 101–111)
Creatinine, Ser: 0.96 mg/dL (ref 0.61–1.24)
GFR calc Af Amer: >60 mL/min (ref >60)
GLUCOSE: 119 mg/dL — AB (ref 65–99)
Potassium: 3.6 mmol/L (ref 3.5–5.1)
SODIUM: 139 mmol/L (ref 135–145)

## 2016-02-09 LAB — CBC
HCT: 36.2 % — ABNORMAL LOW (ref 39.0–52.0)
HEMOGLOBIN: 11.2 g/dL — AB (ref 13.0–17.0)
MCH: 28.9 pg (ref 26.0–34.0)
MCHC: 30.9 g/dL (ref 30.0–36.0)
MCV: 93.3 fL (ref 78.0–100.0)
PLATELETS: 96 10*3/uL — AB (ref 150–400)
RBC: 3.88 MIL/uL — ABNORMAL LOW (ref 4.22–5.81)
RDW: 14.7 % (ref 11.5–15.5)
WBC: 4.4 10*3/uL (ref 4.0–10.5)

## 2016-02-09 NOTE — NC FL2 (Signed)
[place] MEDICAID FL2 LEVEL OF CARE SCREENING TOOL     IDENTIFICATION  Patient Name: Devin Shaw Birthdate: 10/15/1923 Sex: male Admission Date (Current Location): 02/08/2016  [facility] [place]   Facility and Address:  [facility], [address], [place], [place] [address]      Provider Number: [id number]  Attending Physician Name and Address:  Sherren Kernsharles E Fields, MD  Relative Name and Phone Number:       Current Level of Care: Hospital Recommended Level of Care: Skilled Nursing Facility Prior Approval Number:    Date Approved/Denied:   PASRR Number: [id number]  Discharge Plan: SNF    Current Diagnoses: Patient Active Problem List   Diagnosis Date Noted  . Popliteal aneurysm (HCC) 02/08/2016  . AAA (abdominal aortic aneurysm) (HCC) 11/01/2015  . Popliteal artery aneurysm (HCC) 10/14/2015  . 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 Screening for cardiovascular condition 10/11/2015  . AAA (abdominal aortic aneurysm) without rupture (HCC) 10/06/2015    Orientation RESPIRATION BLADDER Height & Weight     Self, Situation, Place  Normal Continent Weight: 166 lb 14.2 oz (75.7 kg) Height:  6' (182.9 cm)  BEHAVIORAL SYMPTOMS/MOOD NEUROLOGICAL BOWEL NUTRITION STATUS   (None)  (None) Continent Diet (Heart healthy)  AMBULATORY STATUS COMMUNICATION OF NEEDS Skin   Limited Assist Verbally Surgical wounds (Closed incision left leg, thigh, and groin)                       Personal Care Assistance Level of Assistance  Bathing, Feeding, Dressing Bathing Assistance: Limited assistance Feeding assistance: Independent Dressing Assistance: Limited assistance     Functional Limitations Info  Sight, Hearing, Speech Sight Info: Adequate Hearing Info: Adequate Speech Info: Adequate    SPECIAL CARE FACTORS FREQUENCY  PT (By licensed PT), OT (By licensed OT)     PT Frequency: 5 x week OT Frequency: 5 x week             Contractures Contractures Info: Not present    Additional Factors Info  Code Status, Allergies Code Status Info: No code on file Allergies Info: No active allergies           Current Medications (02/09/2016):  This is the current hospital active medication list Current Facility-Administered Medications  Medication Dose Route Frequency Provider Last Rate Last Dose  . 0.9 %  sodium chloride infusion  500 mL Intravenous Once PRN Samantha J Rhyne, PA-C      . 0.9 %  sodium chloride infusion   Intravenous Continuous Ames CoupeSamantha J Rhyne, PA-C 75 mL/hr at 02/08/16 1630    . acetaminophen (TYLENOL) tablet 325-650 mg  325-650 mg Oral Q4H PRN Ames CoupeSamantha J Rhyne, PA-C   650 mg at 02/09/16 1047   Or  . acetaminophen (TYLENOL) suppository 325-650 mg  325-650 mg Rectal Q4H PRN Ames CoupeSamantha J Rhyne, PA-C      . alum & mag hydroxide-simeth (MAALOX/MYLANTA) 200-200-20 MG/5ML suspension 15-30 mL  15-30 mL Oral Q2H PRN Samantha J Rhyne, PA-C      . aspirin EC tablet 81 mg  81 mg Oral Daily Samantha J Rhyne, PA-C   81 mg at 02/09/16 1047  . bisacodyl (DULCOLAX) suppository 10 mg  10 mg Rectal Daily PRN Samantha J Rhyne, PA-C      . chlordiazePOXIDE (LIBRIUM) capsule 10 mg  10 mg Oral QHS PRN Ames CoupeSamantha J Rhyne, PA-C   10 mg at 02/08/16 1957  . docusate  sodium (COLACE) capsule 100 mg  100 mg Oral Daily Samantha J Rhyne, PA-C   100 mg at 02/09/16 1047  . enoxaparin (LOVENOX) injection 30 mg  30 mg Subcutaneous Q24H Samantha J Rhyne, PA-C   30 mg at 02/09/16 1047  . fluticasone (FLONASE) 50 MCG/ACT nasal spray 1 spray  1 spray Each Nare Daily Samantha J Rhyne, PA-C   1 spray at 02/09/16 1000  . guaiFENesin-dextromethorphan (ROBITUSSIN DM) 100-10 MG/5ML syrup 15 mL  15 mL Oral Q4H PRN Samantha J Rhyne, PA-C      . hydrALAZINE (APRESOLINE) injection 5 mg  5 mg Intravenous Q20 Min PRN Samantha J Rhyne, PA-C      . labetalol (NORMODYNE,TRANDATE) injection 10 mg  10 mg Intravenous Q10 min PRN Samantha J Rhyne,  PA-C      . loratadine (CLARITIN) tablet 10 mg  10 mg Oral Daily Samantha J Rhyne, PA-C   10 mg at 02/09/16 1047  . magnesium citrate solution 1 Bottle  1 Bottle Oral Once PRN Samantha J Rhyne, PA-C      . metoprolol (LOPRESSOR) injection 2-5 mg  2-5 mg Intravenous Q2H PRN Samantha J Rhyne, PA-C      . morphine 2 MG/ML injection 1-2 mg  1-2 mg Intravenous Q2H PRN [place] Cou[place] Rhyne, PA-C   2 mg at 02/08/16 2358  . ondansetron (ZOFRAN) injection 4 mg  4 mg Intravenous Q6H PRN Samantha J Rhyne, PA-C      . pantoprazole (PROTONIX) EC tablet 40 mg  40 mg Oral Daily Samantha J Rhyne, PA-C   40 mg at 02/09/16 1047  . phenol (CHLORASEPTIC) mouth spray 1 spray  1 spray Mouth/Throat PRN Samantha J Rhyne, PA-C      . polyethylene glycol (MIRALAX / GLYCOLAX) packet 17 g  17 g Oral Daily PRN Samantha J Rhyne, PA-C      . potassium chloride SA (K-DUR,KLOR-CON) CR tablet 20-40 mEq  20-40 mEq Oral Daily PRN Samantha J Rhyne, PA-C      . psyllium (HYDROCIL/METAMUCIL) packet 1 packet  1 packet Oral BID Dara Lords, PA-C   1 packet at 02/09/16 1206  . traMADol (ULTRAM) tablet 50 mg  50 mg Oral Q8H PRN [place] Rhyne, PA-C   50 mg at 02/09/16 1610     Discharge Medications: Please see discharge summary for a list of discharge medications.  Relevant Imaging Results:  Relevant Lab Results:   Additional Information SS#: [id number]  Margarito Liner, LCSW

## 2016-02-09 NOTE — Evaluation (Signed)
Occupational Therapy Evaluation Patient Details Name: Devin Shaw MRN: [id number] DOB: 03/23/1924 Today's Date: 02/09/2016    History of Present Illness 82 year old male scheduled for repair L popliteal artery aneurysm on 02/08/2016. PMH includes: Heart murmur, hyperlipidemia, hx asthma, GERD. Former smoker. BMI 21. S/p EVAR AAA 11/01/15.   Clinical Impression   Pt reports he was independent with ADLs PTA. Currently pt requires overall min assist +2 for safety with functional mobility and max verbal cues for maintaining upright posture. Pt requiring mod assist for LB ADLs at this time secondary to pain. Recommending SNF for follow up in order to maximize independence and safety with ADLs and functional mobility prior to return home. Pt would benefit from continued skilled OT to address established goals.    Follow Up Recommendations  SNF;Supervision/Assistance - 24 hour    Equipment Recommendations  None recommended by OT    Recommendations for Other Services       Precautions / Restrictions Precautions Precautions: Fall Restrictions Weight Bearing Restrictions: No      Mobility Bed Mobility               General bed mobility comments: received in chair  Transfers Overall transfer level: Needs assistance Equipment used: Rolling walker (2 wheeled) Transfers: Sit to/from Stand Sit to Stand: Min assist;Mod assist;+2 physical assistance;+2 safety/equipment (moderate assist to stand from chair, +[facility])         General transfer comment: +2 min assist for elevation to standing and stability. Max cues for upright posture and positioning    Balance Overall balance assessment: Needs assistance Sitting-balance support: Feet supported Sitting balance-Leahy Scale: Fair     Standing balance support: Bilateral upper extremity supported;During functional activity Standing balance-Leahy Scale: Fair Standing balance comment: heavy reliance on RW                            ADL Overall ADL's : Needs assistance/impaired Eating/Feeding: Set up;Sitting   Grooming: Set up;Min guard;Sitting   Upper Body Bathing: Set up;Min guard;Sitting   Lower Body Bathing: Moderate assistance;Sit to/from stand   Upper Body Dressing : Set up;Min guard;Sitting   Lower Body Dressing: Moderate assistance;Sit to/from stand Lower Body Dressing Details (indicate cue type and reason): Pain limiting reaching to feet Toilet Transfer: Minimal assistance;+2 for safety/equipment;Ambulation;BSC;RW   Toileting- Clothing Manipulation and Hygiene: Minimal assistance;Sit to/from stand         General ADL Comments: Pt requires consistent verbal and tactile cue for posture in standing; strong forward flexed posture. Discussed post acute rehab with pt; he is agreeable. VSS throughout.      Vision Vision Assessment?: No apparent visual deficits   Perception     Praxis      Pertinent Vitals/Pain Pain Assessment: 0-10 Pain Score: 8  Pain Descriptors / Indicators: Grimacing;Guarding;Operative site guarding Pain Intervention(s): Limited activity within patient's tolerance;Monitored during session;Patient requesting pain meds-RN notified     Hand Dominance     Extremity/Trunk Assessment Upper Extremity Assessment Upper Extremity Assessment: Overall WFL for tasks assessed   Lower Extremity Assessment Lower Extremity Assessment: Defer to PT evaluation LLE: Unable to fully assess due to pain   Cervical / Trunk Assessment Cervical / Trunk Assessment: Kyphotic   Communication Communication Communication: HOH   Cognition Arousal/Alertness: Awake/alert Behavior During Therapy: WFL for tasks assessed/performed Overall Cognitive Status: No family/caregiver present to determine baseline cognitive functioning  General Comments       Exercises Exercises:  (Gen LE standing exercises, wt shifts, single leg stance)     Shoulder  Instructions      Home Living Family/patient expects to be discharged to:: Private residence Living Arrangements: Alone Available Help at Discharge: Available 24 hours/day;Family (sister for a couple of weeks) Type of Home: House Home Access: Stairs to enter [employer] of Steps: 5 Entrance Stairs-Rails: Left Home Layout: One level     Bathroom Shower/Tub: Chief Strategy Officer: Handicapped height     Home Equipment: Environmental consultant - 2 wheels;Cane - quad;Cane - single point;Bedside commode;Grab bars - tub/shower;Grab bars - toilet;Hospital bed          Prior Functioning/Environment Level of Independence: Independent        Comments: reports that he walks the mall for 30 mins a day with 5 lb weights    OT Diagnosis: Generalized weakness;Acute pain   OT Problem List: Decreased strength;Decreased activity tolerance;Impaired balance (sitting and/or standing);Decreased knowledge of use of DME or AE;Pain   OT Treatment/Interventions: Self-care/ADL training;Therapeutic exercise;Energy conservation;DME and/or AE instruction;Therapeutic activities;Patient/family education;Balance training    OT Goals(Current goals can be found in the care plan section) Acute Rehab OT Goals Patient Stated Goal: get back home OT Goal Formulation: With patient Time For Goal Achievement: 02/23/16 Potential to Achieve Goals: Good ADL Goals Pt Will Perform Grooming: with modified independence;standing Pt Will Perform Upper Body Bathing: with modified independence;sitting Pt Will Perform Lower Body Bathing: with modified independence;sit to/from stand Pt Will Transfer to Toilet: with modified independence;bedside commode;ambulating Pt Will Perform Toileting - Clothing Manipulation and hygiene: with modified independence;sit to/from stand  OT Frequency: Min 2X/week   Barriers to D/C:            Co-evaluation              End of Session Equipment Utilized During Treatment:  Gait belt;Rolling walker Nurse Communication: Mobility status  Activity Tolerance: Patient limited by pain Patient left: in chair;with call bell/phone within reach   Time: 0920-0936 OT Time Calculation (min): 16 min Charges:  OT General Charges $OT Visit: 1 Procedure OT Evaluation $OT Eval Moderate Complexity: 1 Procedure G-Codes:     Gaye Alken M.S., OTR/L Pager: [phone]  02/09/2016, 10:17 AM

## 2016-02-09 NOTE — Progress Notes (Addendum)
  [facility] Progress Note  Subjective  - POD #1  Soreness with left leg incisions.  Objective Filed Vitals:   02/08/16 2248 02/09/16 0350  BP: 131/69 103/64  Pulse: 80 78  Temp: 98.2 F (36.8 C) 99 F (37.2 C)  Resp: 16 17    Intake/Output Summary (Last 24 hours) at 02/09/16 0733 Last data filed at 02/09/16 0500  Gross per 24 hour  Intake 2397.5 ml  Output   3030 ml  Net -632.5 ml   Leg leg incisions clean.  2+ left PT pulse. Audible left DP doppler flow.  Assessment/Planning: 82 y.o. male is s/p: left popliteal aneurysm repair. 1 Day Post-Op   Incisions healing well. Good perfusion to left foot. Needs to ambulate today. ABLA tolerating.  Transfer to 2W.   Raymond GurneyKimberly A Trinh 02/09/2016 7:33 AM -- Agree with above.   2+ left PT pulse.  No hematoma. Very sore. Transfer 2w.  D/c home when ambulatory and pain controlled  Fabienne Brunsharles Fields, MD [facility] [place] Office: [phone] Pager: [phone]  Laboratory CBC    Component Value Date/Time   WBC 4.4 02/09/2016 0530   HGB 11.2* 02/09/2016 0530   HCT 36.2* 02/09/2016 0530   PLT 96* 02/09/2016 0530    BMET    Component Value Date/Time   NA 139 02/09/2016 0530   K 3.6 02/09/2016 0530   CL 103 02/09/2016 0530   CO2 31 02/09/2016 0530   GLUCOSE 119* 02/09/2016 0530   BUN 12 02/09/2016 0530   CREATININE 0.96 02/09/2016 0530   CREATININE 1.01 10/06/2015 1520   CALCIUM 8.1* 02/09/2016 0530   GFRNONAA >60 02/09/2016 0530   GFRAA >60 02/09/2016 0530    COAG Lab Results  Component Value Date   INR 1.10 02/07/2016   INR 1.17 11/01/2015   INR 1.05 10/27/2015   No results found for: PTT  Antibiotics Anti-infectives    Start     Dose/Rate Route Frequency Ordered Stop   02/08/16 2300  cefUROXime (ZINACEF) 1.5 g in dextrose 5 % 50 mL IVPB     1.5 g 100 mL/hr over 30 Minutes Intravenous Every 12 hours 02/08/16 1616 02/09/16 2259   02/08/16 1145  cefUROXime  (ZINACEF) 1.5 g in dextrose 5 % 50 mL IVPB  Status:  Discontinued     1.5 g 100 mL/hr over 30 Minutes Intravenous To Surgery 02/08/16 1136 02/08/16 1604   02/08/16 0700  cefUROXime (ZINACEF) 1.5 g in dextrose 5 % 50 mL IVPB     1.5 g 100 mL/hr over 30 Minutes Intravenous To ShortStay Surgical 02/07/16 0933 02/08/16 1145       Maris BergerKimberly Trinh, PA-C [facility] Office: [phone] Pager: [phone] 02/09/2016 7:33 AM

## 2016-02-09 NOTE — Clinical Social Work Note (Signed)
Clinical Social Work Assessment  Patient Details  Name: Devin Shaw MRN: [id number] Date of Birth: 03-02-1924  Date of referral:  02/09/16               Reason for consult:  Facility Placement, Discharge Planning                Permission sought to share information with:  Facility Sport and exercise psychologist, Family Supports Permission granted to share information::  Yes, Verbal Permission Granted  Name::     Eddie Dibbles  Agency::  SNF's  Relationship::  Daughter  Contact Information:  [phone]  Housing/Transportation Living arrangements for the past 2 months:  [place] of Information:  Patient, Adult Children, Medical Team Patient Interpreter Needed:  None Criminal Activity/Legal Involvement Pertinent to Current Situation/Hospitalization:  No - Comment as needed Significant Relationships:  Adult Children Lives with:  Self Do you feel safe going back to the place where you live?  Yes Need for family participation in patient care:  Yes (Comment)  Care giving concerns:  PT recommending SNF at discharge.   Social Worker assessment / plan:  CSW met with patient. Daughter at bedside. CSW introduced role and explained that discharge planning would be discussed. Confirmed that they are interested in SNF. Patient's daughter states that this is only if he still needs it when he is ready for discharge. RNCM provided home health resources. CSW provided SNF list and discussed SNF preferences. No further concerns. CSW encouraged patient and his daughter to contact CSW as needed. CSW will continue to follow patient and facilitate discharge to SNF if they is still what they are interested in when medically stable.  Employment status:  Retired Forensic scientist:  Medicare PT Recommendations:  [place] / Referral to community resources:  [place]  Patient/Family's Response to care:  Patient and daughter agreeable to SNF placement as  long as he still needs it when medically stable for discharge. Patient's daughter supportive and involved in patient's care. Patient and his daughter polite and appreciated social work intervention.  Patient/Family's Understanding of and Emotional Response to Diagnosis, Current Treatment, and Prognosis:  Patient and his daughter knowledgeable of medical interventions and aware of plan for discharge to SNF once medically stable.  Emotional Assessment Appearance:  Appears stated age Attitude/Demeanor/Rapport:   (Pleasant) Affect (typically observed):  Accepting, Appropriate, Calm, Pleasant Orientation:  Oriented to Self, Oriented to Place, Oriented to Situation Alcohol / Substance use:  Never Used Psych involvement (Current and /or in the community):  No (Comment)  Discharge Needs  Concerns to be addressed:  Care Coordination Readmission within the last 30 days:  No Current discharge risk:  Dependent with Mobility, Lives alone Barriers to Discharge:  No Barriers Identified   Candie Chroman, LCSW 02/09/2016, 1:09 PM

## 2016-02-09 NOTE — Clinical Social Work Placement (Signed)
   CLINICAL SOCIAL WORK PLACEMENT  NOTE  Date:  02/09/2016  Patient Details  Name: Monna FamJohn L Hollingworth MRN: [id number] Date of Birth: 07/10/1924  Clinical Social Work is seeking post-discharge placement for this patient at the Skilled  Nursing Facility level of care (*CSW will initial, date and re-position this form in  chart as items are completed):  Yes   Patient/family provided with [facility] Clinical Social Work Department's list of facilities offering this level of care within the geographic area requested by the patient (or if unable, by the patient's family).  Yes   Patient/family informed of their freedom to choose among providers that offer the needed level of care, that participate in Medicare, Medicaid or managed care program needed by the patient, have an available bed and are willing to accept the patient.  Yes   Patient/family informed of [facility]'s ownership interest in [facility] and [facility], as well as of the fact that they are under no obligation to receive care at these facilities.  PASRR submitted to EDS on 02/09/16     PASRR number received on 02/09/16     Existing PASRR number confirmed on       FL2 transmitted to all facilities in geographic area requested by pt/family on       FL2 transmitted to all facilities within larger geographic area on       Patient informed that his/her managed care company has contracts with or will negotiate with certain facilities, including the following:            Patient/family informed of bed offers received.  Patient chooses bed at       Physician recommends and patient chooses bed at      Patient to be transferred to   on  .  Patient to be transferred to facility by       Patient family notified on   of transfer.  Name of family member notified:        PHYSICIAN       Additional Comment:    _______________________________________________ Margarito LinerSarah C Dahlia Nifong, LCSW 02/09/2016, 1:07 PM

## 2016-02-09 NOTE — Care Management Note (Signed)
Case Management Note  Patient Details  Name: Monna FamJohn L Echavarria MRN: [id number] Date of Birth: 11/30/1923  Subjective/Objective:        s/p: left popliteal aneurysm repair            Action/Plan: Discharge Planning:  NCM spoke to pt and dtr, Mary at bedside. States his wife had HH in the past but does not know the name of agency. Provided dtr with [facility] list/offered choice. Pt and Dtr states they prefers he goes to rehab first before going home. Contacted CSW for referral for SNF placement.    PCP- Sherryll BurgerSHAH, ASHISH MD   Expected Discharge Date:                  Expected Discharge Plan:  Skilled Nursing Facility  In-House Referral:  Clinical Social Work  Discharge planning Services  CM Consult  Post Acute Care Choice:  Home Health Choice offered to:  Adult Children  DME Arranged:  N/A DME Agency:  NA  HH Arranged:  NA HH Agency:  NA  Status of Service:  In process, will continue to follow  Medicare Important Message Given:    Date Medicare IM Given:    Medicare IM give by:    Date Additional Medicare IM Given:    Additional Medicare Important Message give by:     If discussed at Long Length of Stay Meetings, dates discussed:    Additional Comments:  Elliot CousinShavis, Eulas Schweitzer Ellen, RN 02/09/2016, 12:26 PM

## 2016-02-09 NOTE — Evaluation (Signed)
Physical Therapy Evaluation Patient Details Name: Devin Shaw MRN: [id number] DOB: 11/12/1923 Today's Date: 02/09/2016   History of Present Illness  82 year old male scheduled for repair L popliteal artery aneurysm on 02/08/2016. PMH includes: Heart murmur, hyperlipidemia, hx asthma, GERD. Former smoker. BMI 21. S/p EVAR AAA 11/01/15.  Clinical Impression  Patient demonstrates deficits in functional mobility as indicated below. Will need continued skilled PT to address deficits and maximize function. Will see as indicated and progress as tolerated. At this time, recommend ST SNF upon acute discharge as patient lives alone and will need assist.     Follow Up Recommendations SNF    Equipment Recommendations  None recommended by PT    Recommendations for Other Services       Precautions / Restrictions Precautions Precautions: Fall Restrictions Weight Bearing Restrictions: No      Mobility  Bed Mobility               General bed mobility comments: received in chair  Transfers Overall transfer level: Needs assistance Equipment used: Rolling walker (2 wheeled) Transfers: Sit to/from Stand Sit to Stand: Min assist;Mod assist;+2 physical assistance;+2 safety/equipment (moderate assist to stand from chair, +2 min from [facility])         General transfer comment: +2 min assist for elevation to standing and stability. Max cues for upright posture and positioning  Ambulation/Gait Ambulation/Gait assistance: Mod assist Ambulation Distance (Feet): 24 Feet Assistive device: Rolling walker (2 wheeled) Gait Pattern/deviations: Step-to pattern;Decreased stride length;Shuffle;Trunk flexed Gait velocity: decreased Gait velocity interpretation: Below normal speed for age/gender General Gait Details: max cues to elevation to standing, poor ability to maintain upright positioning. Heavy reliance on Rw. increased pain with limited ambulation  Stairs            Wheelchair Mobility     Modified Rankin (Stroke Patients Only)       Balance Overall balance assessment: Needs assistance   Sitting balance-Leahy Scale: Fair     Standing balance support: Bilateral upper extremity supported;During functional activity Standing balance-Leahy Scale: Fair Standing balance comment: heavy reliance on RW                             Pertinent Vitals/Pain Pain Assessment: 0-10 Pain Score: 8  Pain Descriptors / Indicators: Grimacing;Guarding;Operative site guarding Pain Intervention(s): Limited activity within patient's tolerance;Monitored during session;Patient requesting pain meds-RN notified    Home Living Family/patient expects to be discharged to:: Private residence Living Arrangements: Alone Available Help at Discharge: Available 24 hours/day;Family (sister for a couple of weeks) Type of Home: House Home Access: Stairs to enter Entrance Stairs-Rails: Left Entrance Stairs-Number of Steps: 5 Home Layout: One level Home Equipment: Walker - 2 wheels;Cane - quad;Cane - single point;Bedside commode;Grab bars - tub/shower;Grab bars - toilet;Hospital bed      Prior Function Level of Independence: Independent         Comments: reports [place] he walks the mall for 30 mins a day with 5 lb weights     Hand Dominance        Extremity/Trunk Assessment   Upper Extremity Assessment: Overall WFL for tasks assessed           Lower Extremity Assessment: LLE deficits/detail         Communication   Communication: HOH  Cognition Arousal/Alertness: Awake/alert Behavior During Therapy: WFL for tasks assessed/performed Overall Cognitive Status: No family/caregiver present to determine baseline cognitive functioning  General Comments      Exercises        Assessment/Plan    PT Assessment Patient needs continued PT services  PT Diagnosis Difficulty walking;Acute pain   PT Problem List Decreased strength;Decreased  activity tolerance;Decreased balance;Decreased mobility;Decreased cognition;Decreased safety awareness;Pain  PT Treatment Interventions DME instruction;Gait training;Stair training;Functional mobility training;Therapeutic activities;Therapeutic exercise;Balance training;Patient/family education   PT Goals (Current goals can be found in the Care Plan section) Acute Rehab PT Goals Patient Stated Goal: get back home PT Goal Formulation: With patient Time For Goal Achievement: 02/23/16 Potential to Achieve Goals: Good    Frequency Min 3X/week   Barriers to discharge Decreased caregiver support lives alone    Co-evaluation               End of Session Equipment Utilized During Treatment: Gait belt Activity Tolerance: Patient tolerated treatment well;Patient limited by pain Patient left: in chair;with call bell/phone within reach Nurse Communication: Mobility status         Time: 1610-9604 PT Time Calculation (min) (ACUTE ONLY): 26 min   Charges:   PT Evaluation $PT Eval Moderate Complexity: 1 Procedure     PT G CodesFabio Asa 02-14-16, 10:11 AM  Charlotte Crumb, PT DPT  [phone]

## 2016-02-09 NOTE — Progress Notes (Signed)
Patient has arrived on unit from 2 south  Patient oriented to unit, placed on tele, VS were stable.

## 2016-02-09 NOTE — Progress Notes (Signed)
Pt dangled and stand and pivot to chair. Pt tolerated well.

## 2016-02-10 NOTE — Progress Notes (Addendum)
  [facility] Progress Note  Subjective  - POD #2  Medial thigh incision sore. Feels weak.   Objective Filed Vitals:   02/09/16 2022 02/10/16 0559  BP: 119/60 141/61  Pulse: 81 76  Temp: 98.2 F (36.8 C) 98 F (36.7 C)  Resp: 17     Intake/Output Summary (Last 24 hours) at 02/10/16 0752 Last data filed at 02/10/16 0120  Gross per 24 hour  Intake    360 ml  Output    500 ml  Net   -140 ml    Leg leg incisions clean and intact. Mild left leg swelling.  Palpable left PT pulse.   Assessment/Planning: 82 y.o. male is s/p: left popliteal artery aneurysm repair. 2 Days Post-Op   Patient did not ambulate yesterday due to generalized weakness.  PT recommending SNF. Patient prefers to go home at discharge.  Encouraged the patient to ambulate today. Dispo: home vs SNF next 1-2 days.   Raymond GurneyKimberly A Trinh 02/10/2016 7:52 AM -- Agree with above.  Try to ambulate today.  Fabienne Brunsharles Fields, MD [facility] [place] Office: [phone] Pager: [phone]  Laboratory CBC    Component Value Date/Time   WBC 4.4 02/09/2016 0530   HGB 11.2* 02/09/2016 0530   HCT 36.2* 02/09/2016 0530   PLT 96* 02/09/2016 0530    BMET    Component Value Date/Time   NA 139 02/09/2016 0530   K 3.6 02/09/2016 0530   CL 103 02/09/2016 0530   CO2 31 02/09/2016 0530   GLUCOSE 119* 02/09/2016 0530   BUN 12 02/09/2016 0530   CREATININE 0.96 02/09/2016 0530   CREATININE 1.01 10/06/2015 1520   CALCIUM 8.1* 02/09/2016 0530   GFRNONAA >60 02/09/2016 0530   GFRAA >60 02/09/2016 0530    COAG Lab Results  Component Value Date   INR 1.10 02/07/2016   INR 1.17 11/01/2015   INR 1.05 10/27/2015   No results found for: PTT  Antibiotics Anti-infectives    Start     Dose/Rate Route Frequency Ordered Stop   02/08/16 2300  cefUROXime (ZINACEF) 1.5 g in dextrose 5 % 50 mL IVPB     1.5 g 100 mL/hr over 30 Minutes Intravenous Every 12 hours 02/08/16 1616  02/09/16 1237   02/08/16 1145  cefUROXime (ZINACEF) 1.5 g in dextrose 5 % 50 mL IVPB  Status:  Discontinued     1.5 g 100 mL/hr over 30 Minutes Intravenous To Surgery 02/08/16 1136 02/08/16 1604   02/08/16 0700  cefUROXime (ZINACEF) 1.5 g in dextrose 5 % 50 mL IVPB     1.5 g 100 mL/hr over 30 Minutes Intravenous To ShortStay Surgical 02/07/16 0933 02/08/16 1145       Maris BergerKimberly Trinh, PA-C [facility] Office: [phone] Pager: [phone] 02/10/2016 7:52 AM

## 2016-02-10 NOTE — Progress Notes (Signed)
Physical Therapy Treatment Patient Details Name: Devin FamJohn L Docter MRN: [id number] DOB: 03/19/1924 Today's Date: 02/10/2016    History of Present Illness 82 year old male scheduled for repair L popliteal artery aneurysm on 02/08/2016. PMH includes: Heart murmur, hyperlipidemia, hx asthma, GERD. Former smoker. BMI 21. S/p EVAR AAA 11/01/15.    PT Comments    Pt admitted with above diagnosis. Pt currently with functional limitations due to the deficits listed below (see PT Problem List). Pt is able to ambulate with cues and +2 assist.  Progressed distance but has a lot of difficulty maintaining upright posture as well as sequencing RW.  Needs SNF and talked with pt and daughter at length re: benefits of ST SNF.  Pt agreed to SNF ffor therapy.  Pt will benefit from skilled PT to increase their independence and safety with mobility to allow discharge to the venue listed below.    Follow Up Recommendations  SNF     Equipment Recommendations  None recommended by PT    Recommendations for Other Services       Precautions / Restrictions Precautions Precautions: Fall Restrictions Weight Bearing Restrictions: No    Mobility  Bed Mobility Overal bed mobility: Needs Assistance Bed Mobility: Supine to Sit     Supine to sit: Min assist     General bed mobility comments: pt needed assist to elevate trunk.  Transfers Overall transfer level: Needs assistance Equipment used: Rolling walker (2 wheeled) Transfers: Sit to/from Stand Sit to Stand: Min assist;Mod assist;+2 physical assistance;From elevated surface         General transfer comment: +2 min assist for elevation to standing and stability. Max cues for upright posture and positioning  Ambulation/Gait Ambulation/Gait assistance: Min assist;Mod assist;+2 safety/equipment Ambulation Distance (Feet): 70 Feet (40 feet then 30 feet) Assistive device: Rolling walker (2 wheeled) Gait Pattern/deviations: Step-to pattern;Decreased stride  length;Shuffle;Trunk flexed;Wide base of support   Gait velocity interpretation: Below normal speed for age/gender General Gait Details: Pt needed max cues for safety as pt pushing RW too far in front of himself.  Also pt needed cues to stand tall as he maintained flexed posture which he could correct but could not sustain for over a few seconds at a time. Had to follow pt with chair.  Pt had two sitting rest breaks.    Stairs            Wheelchair Mobility    Modified Rankin (Stroke Patients Only)       Balance Overall balance assessment: Needs assistance Sitting-balance support: Bilateral upper extremity supported;Feet supported Sitting balance-Leahy Scale: Poor Sitting balance - Comments: Needed Ue support to sit at EOB.    Standing balance support: Bilateral upper extremity supported;During functional activity Standing balance-Leahy Scale: Poor Standing balance comment: heavy reliance on RW.                     Cognition Arousal/Alertness: Awake/alert Behavior During Therapy: WFL for tasks assessed/performed Overall Cognitive Status: No family/caregiver present to determine baseline cognitive functioning                      Exercises General Exercises - Lower Extremity Ankle Circles/Pumps: AROM;Both;10 reps;Supine Heel Slides: AROM;AAROM;Both;10 reps;Seated    General Comments        Pertinent Vitals/Pain Pain Assessment: 0-10 Pain Score: 8  Pain Location: left LE Pain Descriptors / Indicators: Grimacing;Guarding;Operative site guarding Pain Intervention(s): Limited activity within patient's tolerance;Monitored during session;Premedicated before session;Repositioned  VSS  Home Living                      Prior Function            PT Goals (current goals can now be found in the care plan section) Progress towards PT goals: Progressing toward goals    Frequency  Min 3X/week    PT Plan Current plan remains appropriate     Co-evaluation             End of Session Equipment Utilized During Treatment: Gait belt Activity Tolerance: Patient limited by fatigue;Patient limited by pain Patient left: in chair;with call bell/phone within reach;with chair alarm set;with family/visitor present     Time: 1610-9604 PT Time Calculation (min) (ACUTE ONLY): 32 min  Charges:  $Gait Training: 8-22 mins $Therapeutic Exercise: 8-22 mins                    G CodesTawni Millers F 02/17/16, 3:33 PM [employer] Acute Rehabilitation [phone] (pager)

## 2016-02-10 NOTE — Clinical Social Work Note (Signed)
Patient transferred from 3S to 2W. Handoff information given to 2W CSW.  This CSW signing off.  Pace Lamadrid, CSW [phone]  

## 2016-02-11 ENCOUNTER — Inpatient Hospital Stay (HOSPITAL_COMMUNITY): Payer: Medicare Other

## 2016-02-11 DIAGNOSIS — I724 Aneurysm of artery of lower extremity: Secondary | ICD-10-CM

## 2016-02-11 NOTE — Progress Notes (Addendum)
      I went back to the patient's room after his daughter was here for af visit.  Therapy did work with him again and he did a little better.  He really wants to return home.  He was walking 1 hour a day and doing other exercises.  His daughter and I walked him in the halls multiple times to show her what to help him with as far as verbal que's and using a rolling walker for the first time.  He did much better and she feels better about him going home.  She agreed  to stay with him for 2 weeks until he back to [place].    I order a rolling walker for home.  He is not taking any narcotic pain medication.  Plan discharge home tomorrow with his daughter.  F/U in 2 weeks with Dr. Inocencio HomesFileds  COLLINS, EMMA [facility] PA-C

## 2016-02-11 NOTE — Progress Notes (Signed)
Physical Therapy Treatment Patient Details Name: Devin Shaw MRN: [id number] DOB: 09-Mar-1924 Today's Date: 02/11/2016    History of Present Illness 82 year old male scheduled for repair L popliteal artery aneurysm on 02/08/2016. PMH includes: Heart murmur, hyperlipidemia, hx asthma, GERD. Former smoker. BMI 21. S/p EVAR AAA 11/01/15.    PT Comments    Pt admitted with above diagnosis. Pt currently with functional limitations due to continued safety awareness issues, balance and endurance deficits. Pt ambulates with RW in controlled environment fairly well but struggles with turns as well as stuggles with head turns and distracting environments.  Pt with poor safety awareness at times needing constant cues.  Daughter came in after first walk and she walked with [place] on the second attempt and she is aware of pts safety issues with ambulation.  Feel that SNF can progress pt to incr his safety and strength prior to d/c home.   Pt will benefit from skilled PT to increase their independence and safety with mobility to allow discharge to the venue listed below.    Follow Up Recommendations  SNF     Equipment Recommendations  Rolling walker with 5" wheels (tall RW)    Recommendations for Other Services       Precautions / Restrictions Precautions Precautions: Fall Restrictions Weight Bearing Restrictions: No    Mobility  Bed Mobility               General bed mobility comments: in chair on arrival.   Transfers Overall transfer level: Needs assistance Equipment used: Rolling walker (2 wheeled) Transfers: Sit to/from Stand Sit to Stand: Mod assist;Min assist         General transfer comment: Pt needed mod assist for sit to stand.  Pt had LOB back into chair upon standing needing mod assist to steady on first attempt.  Second attempt, pt needed min guard assist but needs incr time to get up from low surfaces.  Had a conversation with pt about his chairs at home and their height and  pt states he has a chair thats taller and would use it.  Then his daughter came in and stated he has a lift chair.  Pt lacks awareness to problem solve through safest options.   Ambulation/Gait Ambulation/Gait assistance: Min guard;Min assist Ambulation Distance (Feet): 650 Feet (400 feet then 250 feet on second walk. ) Assistive device: Rolling walker (2 wheeled) Gait Pattern/deviations: Step-to pattern;Decreased stride length;Trunk flexed Gait velocity: decreased Gait velocity interpretation: Below normal speed for age/gender General Gait Details: Overall, pt ambulating much better today.  Pt was able to control RW well except with turns and when backing up needs assist for safety.  Pt getting walker tangled in feet with turns.  Pt also needed occasional cues to stand up tall.    Stairs            Wheelchair Mobility    Modified Rankin (Stroke Patients Only)       Balance Overall balance assessment: Needs assistance Sitting-balance support: Bilateral upper extremity supported;Feet supported Sitting balance-Leahy Scale: Good     Standing balance support: Bilateral upper extremity supported;During functional activity Standing balance-Leahy Scale: Poor Standing balance comment: still relies on RW for balance.  Cannot stand statically without both hands on RW.  Cannot withstand challenges to balance without assist.              High level balance activites: Direction changes;Turns;Sudden stops;Head turns High Level Balance Comments: min assist for stability with above.  Cognition Arousal/Alertness: Awake/alert Behavior During Therapy: WFL for tasks assessed/performed Overall Cognitive Status: History of cognitive impairments - at baseline       Memory: Decreased short-term memory              Exercises General Exercises - Lower Extremity Ankle Circles/Pumps: AROM;Both;10 reps;Supine Heel Slides: AROM;AAROM;Both;10 reps;Seated    General Comments  General comments (skin integrity, edema, etc.): Pt scored 15/24 on DGI suggesting risk of falls without RW.  Pt has safety issues even with RW.  Pt states that when he goes home he can use his 2 canes.  Lacks safety awareness and does not follow safety precautions.  Feel he will go home and perform unsafe actions and needs to be stronger prior to d/c home.       Pertinent Vitals/Pain Pain Assessment: No/denies pain  VSS    Home Living                      Prior Function            PT Goals (current goals can now be found in the care plan section) Progress towards PT goals: Progressing toward goals    Frequency  Min 3X/week    PT Plan Current plan remains appropriate    Co-evaluation             End of Session Equipment Utilized During Treatment: Gait belt Activity Tolerance: Patient limited by fatigue;Patient limited by pain Patient left: in chair;with call bell/phone within reach;with chair alarm set;with family/visitor present     Time: 1021-1050 PT Time Calculation (min) (ACUTE ONLY): 29 min  Charges:  $Gait Training: 8-22 mins $Therapeutic Exercise: 8-22 mins                    G Codes:      WhiteAlvis Lemmings, Shalaunda Weatherholtz F 02/11/2016, 11:08 AM Eber Jonesawn Alyia Lacerte,PT Acute Rehabilitation [phone] (pager)

## 2016-02-11 NOTE — Progress Notes (Signed)
VASCULAR LAB PRELIMINARY  ARTERIAL  ABI completed:    RIGHT    LEFT    PRESSURE WAVEFORM  PRESSURE WAVEFORM  BRACHIAL 153 Triphasic BRACHIAL 155 Triphasic  DP 174 Triphasic DP 159 Triphasic  PT 181 Triphasic PT 157 Triphasic    RIGHT LEFT  ABI 1.17 1.03   ABIs and Doppler waveforms are within normal limits bilaterally at rest post operative  Evella Kasal, RVS 02/11/2016, 7:04 PM

## 2016-02-11 NOTE — Care Management Important Message (Signed)
Important Message  Patient Details  Name: Monna FamJohn L Hanline MRN: [id number] Date of Birth: 05/26/1924   Medicare Important Message Given:  Yes    Maikayla Beggs Abena 02/11/2016, 12:10 PM

## 2016-02-11 NOTE — Progress Notes (Signed)
[facility]  Subjective  - he really wants to go home.  He is doing straight leg raises in the bed.   Objective 122/61 61 97.4 F (36.3 C) (Oral) 14 90%  Intake/Output Summary (Last 24 hours) at 02/11/16 0724 Last data filed at 02/10/16 2033  Gross per 24 hour  Intake    960 ml  Output   2025 ml  Net  -1065 ml    Palpable DP pulses bil 2+ Incisions clean and dry   Assessment/Planning: POD # 3 s/p: left popliteal artery aneurysm repair.  We will have nursing walk with him after breakfast He really wants to go home  Clinton GallantCOLLINS, Beryl Balz [facility] 02/11/2016 7:24 AM --  Laboratory Lab Results:  Recent Labs  02/09/16 0530  WBC 4.4  HGB 11.2*  HCT 36.2*  PLT 96*   BMET  Recent Labs  02/09/16 0530  NA 139  K 3.6  CL 103  CO2 31  GLUCOSE 119*  BUN 12  CREATININE 0.96  CALCIUM 8.1*    COAG Lab Results  Component Value Date   INR 1.10 02/07/2016   INR 1.17 11/01/2015   INR 1.05 10/27/2015   No results found for: PTT

## 2016-02-11 NOTE — Care Management Note (Signed)
Case Management Note Previous CM note initiated by Devin DonningAlesia Shavis RN, CM  Patient Details  Name: Devin Shaw MRN: [id number] Date of Birth: 09/26/1923  Subjective/Objective:        s/p: left popliteal aneurysm repair            Action/Plan: Discharge Planning:  NCM spoke to pt and dtr, Devin Shaw at bedside. States his wife had HH in the past but does not know the name of agency. [facility]. Pt and Dtr states they prefers he goes to rehab first before going home. Contacted CSW for referral for SNF placement.    PCP- Sherryll BurgerSHAH, ASHISH MD   Expected Discharge Date:                  Expected Discharge Plan:  Home w Home Health Services  In-House Referral:  Clinical Social Work  Discharge planning Services  CM Consult  Post Acute Care Choice:  Home Health, Durable Medical Equipment Choice offered to:  Adult Children  DME Arranged:  Walker rolling DME Agency:  [facility]  HH Arranged:    HH Agency:     Status of Service:  In process, will continue to follow  Medicare Important Message Given:  Yes Date Medicare IM Given:    Medicare IM give by:    Date Additional Medicare IM Given:    Additional Medicare Important Message give by:     If discussed at Long Length of Stay Meetings, dates discussed:    Additional Comments:  02/11/16- 1500- Lovina Zuver RN, CM- noted that pt really wants to return home with daughter- order for RW placed- [facility]an [facility] need - to be delivered to room prior to discharge- [facility] since pt deciding on home vs [facility]. CM to follow  Zenda AlpersWebster, Lenn SinkKristi Hall, RN 02/11/2016, 3:02 PM

## 2016-02-11 NOTE — Progress Notes (Signed)
Occupational Therapy Treatment Patient Details Name: Devin Shaw MRN: [id number] DOB: 1924-03-02 Today's Date: 02/11/2016    History of present illness 82 year old male scheduled for repair L popliteal artery aneurysm on 02/08/2016. PMH includes: Heart murmur, hyperlipidemia, hx asthma, GERD. Former smoker. BMI 21. S/p EVAR AAA 11/01/15.   OT comments  Pt with improving independence with ADLs - requires min A overall.  Reviewed walker safety, and safety with ADLs with both pt and daughter.  Pt is eager to discharge home, and daughter will be available to provide 24 hour assist.   Follow Up Recommendations  Home health OT;Supervision/Assistance - 24 hour    Equipment Recommendations  None recommended by OT    Recommendations for Other Services      Precautions / Restrictions Precautions Precautions: Fall       Mobility Bed Mobility                  Transfers Overall transfer level: Needs assistance Equipment used: Rolling walker (2 wheeled) Transfers: Sit to/from [employer] Sit to Stand: Min guard Stand pivot transfers: Min guard            Balance Overall balance assessment: Needs assistance Sitting-balance support: Feet supported Sitting balance-Leahy Scale: Good     Standing balance support: Single extremity supported;During functional activity Standing balance-Leahy Scale: Poor Standing balance comment: LOB when pt lifted both hand from walker requiring min A to recover                    ADL Overall ADL's : Needs assistance/impaired     Grooming: Min guard;Standing;Wash/dry hands;Oral care;Wash/dry face       Lower Body Bathing: Minimal assistance;Sit to/from stand       Lower Body Dressing: Minimal assistance;Sit to/from stand Lower Body Dressing Details (indicate cue type and reason): Pt able to don/doff socks and shoes.  Simulated donning pants.  Reinforced need to have one hand on walker at all times.  Toilet  Transfer: Minimal assistance;Ambulation;Comfort height toilet;Regular Toilet;Grab bars;RW Statistician Details (indicate cue type and reason): Pt with posterior LOB when standing due to pt attempting to adjsut gown with both hands  Toileting- Clothing Manipulation and Hygiene: Min guard;Sit to/from stand       Functional mobility during ADLs: Min guard;Rolling walker General ADL Comments: Long discussion with pt and daugther re: walker safety, removing area rugs, need to have one hand on walker at all times, staying inside walker at all times, using urinal at nigh, and not attempting to ambulate into BR at might, and to call daugther anytime he needs her       Vision                     Perception     Praxis      Cognition   Behavior During Therapy: [facility] for tasks assessed/performed Overall Cognitive Status: History of cognitive impairments - at baseline                       Extremity/Trunk Assessment               Exercises     Shoulder Instructions       General Comments      Pertinent Vitals/ Pain       Pain Assessment: No/denies pain  Home Living  Prior Functioning/Environment              Frequency Min 2X/week     Progress Toward Goals  OT Goals(current goals can now be found in the care plan section)  Progress towards OT goals: Progressing toward goals  ADL Goals Pt Will Perform Grooming: with modified independence;standing Pt Will Perform Upper Body Bathing: with modified independence;sitting Pt Will Perform Lower Body Bathing: with modified independence;sit to/from stand Pt Will Transfer to Toilet: with modified independence;bedside commode;ambulating Pt Will Perform Toileting - Clothing Manipulation and hygiene: with modified independence;sit to/from stand  Plan Discharge plan remains appropriate    Co-evaluation                 End of Session Equipment  Utilized During Treatment: Gait belt;Rolling walker   Activity Tolerance Patient tolerated treatment well   Patient Left in chair;with call bell/phone within reach;with chair alarm set;with family/visitor present   Nurse Communication Mobility status        Time: [id number] OT Time Calculation (min): 43 min  Charges: OT General Charges $OT Visit: 1 Procedure OT Treatments $Self Care/Home Management : 8-22 mins $Therapeutic Activity: 23-37 mins  Tasneem Cormier M 02/11/2016, 6:17 PM

## 2016-02-11 NOTE — Clinical Social Work Note (Signed)
CSW spoke with patient and patient's daughter. Patient reported he would like to go home. Patient's daughter [facility]. CSW informed patient's RN. [facility] and spoke with Elease HashimotoPatricia with Admissions. She reported she would have bed availability for patient on weekend. CSW continuing to follow for discharge needs.   Valero EnergyShonny Courteney Alderete, LCSW [phone]

## 2016-02-12 NOTE — Progress Notes (Addendum)
Patient in stable condition, patient education done with patient and family, they verbalised understanding, iv removed, tele dc, ccmd notified , patient taken off the unit on a wheelchair

## 2016-02-12 NOTE — Discharge Summary (Signed)
Discharge Summary     Devin Shaw 12/05/1923 82 y.o. 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 male who returns for postoperative follow-up today. 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 This was uneventful. He returns today reporting no drainage from his right groin incision. He has essentially return to normal activities. However he currently is only walking 1 mile every day as opposed to the 3-1/2 miles he was walking every day preoperatively. He states he is still recovering overall. He denies any pain in his feet. He denies any abdominal or back pain.  Hospital Course:  The patient was admitted to the hospital and taken to the operating room on 02/08/2016 and underwent: Left above-knee popliteal to below knee popliteal bypass,,ipsilateral reversed greater saphenous vein, ultrasound vein mapping    The pt tolerated the procedure well and was transported to the PACU in good condition.   By POD 1, he had a palpable left PT pulse.  He was transferred to 2 [place].   He was slow to ambulate post operatively.  PT walked with the pt.  PA also walked with pt and his daughter in the hallways.  He did well with a rolling walker.  He is discharged home with PT.  He is also doing leg raises in the bed.   He is discharged home with his daughter, who will stay with him for the next couple of weeks.   The remainder of the hospital course consisted of increasing mobilization and increasing intake of solids without difficulty.  CBC    Component Value Date/Time   WBC 4.4 02/09/2016 0530   RBC 3.88* 02/09/2016 0530   HGB 11.2* 02/09/2016 0530   HCT 36.2* 02/09/2016 0530   PLT 96* 02/09/2016 0530   MCV 93.3 02/09/2016 0530   MCH 28.9 02/09/2016 0530   MCHC 30.9 02/09/2016 0530   RDW 14.7 02/09/2016 0530    BMET    Component Value Date/Time   NA 139 02/09/2016 0530   K 3.6 02/09/2016 0530   CL 103 02/09/2016 0530   CO2 31 02/09/2016 0530   GLUCOSE 119* 02/09/2016 0530   BUN 12 02/09/2016 0530   CREATININE 0.96 02/09/2016 0530   CREATININE 1.01 10/06/2015 1520   CALCIUM 8.1* 02/09/2016 0530   GFRNONAA >60 02/09/2016 0530   GFRAA >60 02/09/2016 0530     Discharge Instructions    Call MD for:  redness, tenderness, or signs of infection (pain, swelling, bleeding, redness, odor or green/yellow discharge around incision site)    Complete by:  As directed      Call MD for:  severe or increased pain, loss or decreased feeling  in affected limb(s)    Complete by:  As directed      Call MD for:  temperature >100.5    Complete by:  As directed      Discharge instructions    Complete by:  As directed   You may shower as needed.  Gradually walk for exercise as you get stronger.     Driving Restrictions    Complete by:  As directed   No driving for 1-2 weeks     Lifting restrictions    Complete by:  As directed   No  heavy lifting for 4 weeks.  You may use your 5 lb. Hand weights to keep your arms strong.     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 Popliteal aneurysm (HCC) 02/08/2016  . AAA (abdominal aortic aneurysm) (HCC) 11/01/2015  . Popliteal artery aneurysm (HCC) 10/14/2015  . 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 Screening for cardiovascular condition 10/11/2015  . AAA (abdominal aortic aneurysm) without rupture (HCC) 10/06/2015   Past Medical History  Diagnosis Date  . AAA (abdominal aortic aneurysm) (HCC)   . GERD (gastroesophageal reflux disease)   . Anxiety   . Varicose veins   . Hyperglycemia   .  Hyperlipidemia   . Vertigo   . Popliteal fullness     has multiple aneurysms behind his left knee  . H/O seasonal allergies     ragweed  . Dry skin   . Heart murmur   . Asthma     "had it 20 years ago"  . Osteoarthritis   . Arthritis     "hips" (02/08/2016)       Medication List    TAKE these medications        aspirin EC 81 MG tablet  Take 1 tablet (81 mg total) by mouth daily.     cetirizine 10 MG tablet  Commonly known as:  ZYRTEC  Take 10 mg by mouth daily.     chlordiazePOXIDE 5 MG capsule  Commonly known as:  LIBRIUM  Take 10 mg by mouth at bedtime as needed for anxiety.     FLONASE 50 MCG/ACT nasal spray  Generic drug:  fluticasone  Place 1 spray into both nostrils daily.     oxyCODONE-acetaminophen 5-325 MG tablet  Commonly known as:  PERCOCET/ROXICET  Take 1 tablet by mouth every 6 (six) hours as needed for moderate pain.     psyllium 58.6 % powder  Commonly known as:  METAMUCIL  Take 1 packet by mouth 2 (two) times daily. 3 spoonfuls (heaping)   2 x  daily        Prescriptions given: none  Disposition: home with daughter  Patient's condition: is Good  Follow up: 1. Dr. Darrick Penna in 2 weeks   Doreatha Massed, PA-C [facility] [phone] 02/12/2016  8:28 AM  - For VQI Registry use --- Instructions: Press F2 to tab through selections.  Delete question if not applicable.   Post-op:  Wound infection: No  Graft infection: No  Transfusion: No  If yes, n/a units given New Arrhythmia: No Ipsilateral amputation: No, [ ]  Minor, [ ]  BKA, [ ]  AKA Discharge patency: [x ] Primary, [ ]  Primary assisted, [ ]  Secondary, [ ]  Occluded Patency judged by: [ ]  Dopper only, [ ]  Palpable graft pulse, [x]  Palpable distal pulse, [ ]  ABI inc. > 0.15, [ ]  Duplex Discharge ABI: R 1.17, L 1.03 D/C Ambulatory Status: Ambulatory  Complications: MI: No, [ ]  Troponin only, [ ]  EKG or Clinical CHF: No Resp failure:No, [ ]  Pneumonia, [ ]   Ventilator Chg in renal function: No, [ ]  Inc. Cr > 0.5, [ ]  Temp. Dialysis, [ ]  Permanent dialysis Stroke: No, [ ]  Minor, [ ]  Major Return to OR: No  Reason for return to OR: [ ]  Bleeding, [ ]  Infection, [ ]   Thrombosis,  Revision  Discharge medications: Statin use:  no ASA use:  yes Plavix use:  no Beta blocker use: no ACEI use:   no ARB use:  no Coumadin use: no

## 2016-02-12 NOTE — Care Management Note (Signed)
Case Management Note  Patient Details  Name: Devin Shaw MRN: [id number] Date of Birth: 07/26/1924  Subjective/Objective:                   AAA (abdominal aortic aneurysm) [facility]) Action/Plan: Discharge planning Expected Discharge Date:  02/12/16               Expected Discharge Plan:  [place]  In-House Referral:     Discharge planning Services  CM Consult  Post Acute Care Choice:  Home Health, Durable Medical Equipment Choice offered to:  Adult Children  DME Arranged:  Walker rolling DME Agency:  [place] Arranged:  PT [place] Agency:  [place]  Status of Service:  Completed, signed off  Medicare Important Message Given:  Yes Date Medicare IM Given:    Medicare IM give by:    Date Additional Medicare IM Given:    Additional Medicare Important Message give by:     If discussed at [place] of Stay Meetings, dates discussed:    Additional Comments: Cm met with pt in room to see if selection of home health agency has been decided upon; pt asks if I would please speak with his daughter, Devin Shaw.  Cm called Devin Shaw who [facility] to render HHPT.  Referral called to [facility] rep, Tiffany. CM called [facility] DME rep, james to please deliver the rolling walker to room.  No other CM needs were communicated. Dellie Catholic, RN 02/12/2016, 2:05 PM

## 2016-02-12 NOTE — Progress Notes (Addendum)
  Progress Note    02/12/2016 8:20 AM 4 Days Post-Op  Subjective:  No complaints except for the food.  Shows me his straight leg raises.  Afebrile HR 60's-70's NSR 120's-130's systolic 97% RA  Filed Vitals:   02/11/16 2017 02/12/16 0506  BP: 125/73 137/76  Pulse: 72 69  Temp: 97.3 F (36.3 C) 97.5 F (36.4 C)  Resp: 18 20    Physical Exam: Cardiac:  regular Lungs:  Non labored  Incisions:  Clean and dry Extremities:  + palpable left DP pulse   CBC    Component Value Date/Time   WBC 4.4 02/09/2016 0530   RBC 3.88* 02/09/2016 0530   HGB 11.2* 02/09/2016 0530   HCT 36.2* 02/09/2016 0530   PLT 96* 02/09/2016 0530   MCV 93.3 02/09/2016 0530   MCH 28.9 02/09/2016 0530   MCHC 30.9 02/09/2016 0530   RDW 14.7 02/09/2016 0530    BMET    Component Value Date/Time   NA 139 02/09/2016 0530   K 3.6 02/09/2016 0530   CL 103 02/09/2016 0530   CO2 31 02/09/2016 0530   GLUCOSE 119* 02/09/2016 0530   BUN 12 02/09/2016 0530   CREATININE 0.96 02/09/2016 0530   CREATININE 1.01 10/06/2015 1520   CALCIUM 8.1* 02/09/2016 0530   GFRNONAA >60 02/09/2016 0530   GFRAA >60 02/09/2016 0530    INR    Component Value Date/Time   INR 1.10 02/07/2016 1550     Intake/Output Summary (Last 24 hours) at 02/12/16 0820 Last data filed at 02/11/16 2018  Gross per 24 hour  Intake   1000 ml  Output   1350 ml  Net   -350 ml   ABI's 02/11/16:  RIGHT    LEFT    PRESSURE WAVEFORM  PRESSURE WAVEFORM  BRACHIAL 153 Triphasic BRACHIAL 155 Triphasic  DP 174 Triphasic DP 159 Triphasic  PT 181 Triphasic PT 157 Triphasic    RIGHT LEFT  ABI 1.17 1.03         Assessment:  82 y.o. male is s/p:  left popliteal artery aneurysm repair  4 Days Post-Op  Plan: -pt doing well this morning with easily palpable left DP pulse.  Post op ABI's normal. -pt walked in the hallways yesterday with several times with PT and daughter and both are comfortable with  him going home-she will be staying with him until he is back to baseline. -f/u with Dr. Darrick PennaFields in 2 weeks. -explained to the pt that he can be up walking as much as he feels he can do.  When resting, elevate leg to help with swelling.   Doreatha MassedSamantha Rhyne, PA-C [facility] [phone] 02/12/2016 8:20 AM    I have examined the patient, reviewed and agree with above. Doing well. Stable for discharge. Did have some superficial separation of his upper incision for vein harvest. I instructed his daughter on local wound care with this. Will be seen in 2 weeks with Dr. Meryl Crutch[place]  Kitara Hebb, MD 02/12/2016 2:32 PM

## 2016-02-14 ENCOUNTER — Telehealth: Payer: Self-pay

## 2016-02-14 NOTE — Telephone Encounter (Signed)
rec'd phone call from [facility] Physical Therapist.  Reported the left proximal groin incision has approx. 1/8-1/4 " open area.  Reported the peri-wound looks good; denied redness / signs of infection/ or fever.  Requested a verbal order to send a nurse to pt's home for nursing assessment of wound.  Verb. order given; Physical Therapist will have the nurse call office with wound assessment.

## 2016-02-15 ENCOUNTER — Encounter: Payer: Self-pay | Admitting: Family

## 2016-02-15 ENCOUNTER — Ambulatory Visit (INDEPENDENT_AMBULATORY_CARE_PROVIDER_SITE_OTHER): Payer: Medicare Other | Admitting: Family

## 2016-02-15 ENCOUNTER — Telehealth: Payer: Self-pay

## 2016-02-15 VITALS — BP 146/89 | HR 75 | Temp 97.2°F | Resp 16 | Ht 75.0 in | Wt 170.0 lb

## 2016-02-15 DIAGNOSIS — T8131XA Disruption of external operation (surgical) wound, not elsewhere classified, initial encounter: Secondary | ICD-10-CM

## 2016-02-15 DIAGNOSIS — I724 Aneurysm of artery of lower extremity: Secondary | ICD-10-CM

## 2016-02-15 NOTE — Progress Notes (Signed)
Filed Vitals:   02/15/16 1328 02/15/16 1329  BP: 161/96 146/89  Pulse: 75 75  Temp: 97.2 F (36.2 C)   Resp: 16   Height: 6\' 3"  (1.905 m)   Weight: 170 lb (77.111 kg)   SpO2: 97%

## 2016-02-15 NOTE — Telephone Encounter (Signed)
Spoke to pt and pt's daughter; pt refused appt. They said having the [facility] nurse look at it is sufficient. They said [facility] said he may need an anti biotic. I advised them that we would need to see the pt to prescribe the medication. They still refused an appt. I advised them to call if his situation changes or he worsens.

## 2016-02-15 NOTE — Telephone Encounter (Signed)
[facility]  Reported the pt. Has a 0.8 cm open area at the upper left thigh incision.  Reported there is minimal depth to this; stated approx. 0.1 cm. Depth.  Reported there is redness, warmth and a firm swelling in the peri-wound.  Reported a thin, blood drainage; denied odor.  Requesting an order for an antibiotic, and wet-dry NS dressings.  Advised will schedule the pt. For incision check, to determine treatment.  Agreed.

## 2016-02-15 NOTE — Progress Notes (Signed)
    Postoperative Visit   History of Present Illness  Devin Shaw is a 82 y.o. year old male patient of Dr. Darrick PennaFields who is s/p Left above-knee popliteal to below knee popliteal bypass with ipsilateral reversed greater saphenous vein on 02/08/16, for Left popliteal aneurysm. [facility] today [facility] with report of redness, warmth, and thin bloody drainage from incision. Pt and daughter with pt today report concern about the proximal aspect of left medial thigh incision is open. Pt denies fever or chills, states he is not on an antibx. He denies claudication sx's with walking. He is not elevating his left foot above his heart and his legs are mostly dependent. The lower leg incision edges are well proximated, healing well. There is 2+ non pitting edema of the left thigh, calf, and foot.   The patient is able to complete their activities of daily living.    For VQI Use Only  PRE-ADM LIVING: Home  AMB STATUS: Ambulatory with Assistance  Physical Examination  Filed Vitals:   02/15/16 1328 02/15/16 1329  BP: 161/96 146/89  Pulse: 75 75  Temp: 97.2 F (36.2 C)   Resp: 16   Height: 6\' 3"  (1.905 m)   Weight: 170 lb (77.111 kg)   SpO2: 97%    Body mass index is 21.25 kg/(m^2).  Left leg: Incisions are well healed, proximal aspect of left medial thigh incision is open, scant s/s drainage, minimal erythema, generalized 2+ non pitting edema in entire left leg and foot, left DP and PTpedal pulses are palpable. Bilateral groin pulses are palpable.  Medical Decision Making  Devin FamJohn L Mellott is a 82 y.o. year old male who presents s/p Left above-knee popliteal to below knee popliteal bypass, ipsilateral reversed greater saphenous vein on 02/08/16, for Left popliteal aneurysm. He leaves his legs in a dependent position which encourages swelling in the left leg postoperatively. The swelling has facilitated the dehiscence of the most proximal aspect of the upper thigh medial  incision. The remainder of his incision edges are well proximated, surgical glue yet present. There are no signs of infection. Dr. Arbie CookeyEarly spoke with and examined pt.  Pt and daughter instructed in proper elevation of legs and feet above the level of his heart. Continue daily showering and cleansing of left leg incisions.  Continue daily dry dressing changes as it seem he has just enough drainage not to need wet to dry dressing changes. Follow up with Dr. Darrick PennaFields as scheduled on 03/02/16.   The patient's bypass incisions are healing appropriately.   Subrena Devereux, Carma LairSUZANNE L, RN, MSN, FNP-C [facility] [place] Office: [phone]  02/15/2016, 1:42 PM  Clinic MD: Early

## 2016-02-15 NOTE — Telephone Encounter (Signed)
Called pt's daughter and explained that based on the symptoms that were [facility], the pt. Would need to be assessed in the office to determine treatment.  Daughter requested that an antibiotic be called in, instead of bringing pt. To the office.  Again, advised that he would need to be assessed in the office for antibiotic to be ordered.  Daughter agreed to an appt. On 6/22, when Dr. Jettie BoozeField is in the office.  Advised [facility], and request to apply wet-dry NS dressing daily, until seen in the office on 6/22.  Advise of an appt. @ 1:15 PM 6/22, on the NP schedule.  Daughter agreed. Notified the [facility], Okey RegalCarol Bullins of need to do NS wet-dry dressing tomorrow, and will see the pt. On Thursday at 1:15 for evaluation.  [facility] agreed.

## 2016-02-18 ENCOUNTER — Telehealth: Payer: Self-pay | Admitting: Vascular Surgery

## 2016-02-18 NOTE — Telephone Encounter (Signed)
-----   Message from Sharee PimpleMarilyn K McChesney, RN sent at 02/14/2016  9:24 AM EDT ----- Regarding: schedule   ----- Message -----    From: Dara LordsSamantha J Rhyne, PA-C    Sent: 02/12/2016   8:26 AM      To: [facility] Charge Pool  S/p left popliteal aneurysm artery repair.  F/u with [facility] in 2 weeks.  Thanks

## 2016-02-18 NOTE — Telephone Encounter (Signed)
Spoke w/ pt to sch appt 7/6 at 3:45.

## 2016-02-21 ENCOUNTER — Encounter: Payer: Self-pay | Admitting: Vascular Surgery

## 2016-03-02 ENCOUNTER — Encounter: Payer: Self-pay | Admitting: Vascular Surgery

## 2016-03-02 ENCOUNTER — Encounter: Payer: Medicare Other | Admitting: Vascular Surgery

## 2016-03-02 ENCOUNTER — Ambulatory Visit (INDEPENDENT_AMBULATORY_CARE_PROVIDER_SITE_OTHER): Payer: Self-pay | Admitting: Vascular Surgery

## 2016-03-02 VITALS — BP 148/84 | HR 67 | Temp 98.0°F | Ht 75.0 in | Wt 172.4 lb

## 2016-03-02 DIAGNOSIS — I724 Aneurysm of artery of lower extremity: Secondary | ICD-10-CM

## 2016-03-02 NOTE — Progress Notes (Signed)
  POST OPERATIVE OFFICE NOTE    CC:  F/u for surgery  HPI:  This is a 82 y.o. male who is s/p left above knee popliteal to below knee popliteal bypass with ipsilateral reversed GSV on 02/08/16.  He was seen back in the office on 02/15/16 by the NP at which time he had a dehiscence of the most proximal aspect of the upper thigh incision.  He was instructed to elevated his legs.  Today, he states that he worked with PT this morning and was pretty vigorous.  He states his lower incision has been draining for about 2 weeks.  He denies any fevers. He states he has been walking every day in the house for 30 minutes.  He states he was walking outside and got some alleriges and took some cortisone tablets.  He was signed off by PT today.  No Active Allergies  Current Outpatient Prescriptions  Medication Sig Dispense Refill  . aspirin EC 81 MG tablet Take 1 tablet (81 mg total) by mouth daily. 30 tablet 0  . cetirizine (ZYRTEC) 10 MG tablet Take 10 mg by mouth daily.    . chlordiazePOXIDE (LIBRIUM) 5 MG capsule Take 10 mg by mouth at bedtime as needed for anxiety.     . fluticasone (FLONASE) 50 MCG/ACT nasal spray Place 1 spray into both nostrils daily.     Marland Kitchen. oxyCODONE-acetaminophen (PERCOCET/ROXICET) 5-325 MG tablet Take 1 tablet by mouth every 6 (six) hours as needed for moderate pain. 20 tablet 0  . psyllium (METAMUCIL) 58.6 % powder Take 1 packet by mouth 2 (two) times daily. 3 spoonfuls (heaping)   2 x  daily     No current facility-administered medications for this visit.     ROS:  See HPI  Physical Exam:  Filed Vitals:   03/02/16 1550 03/02/16 1551  BP: 145/82 148/84  Pulse: 67   Temp: 98 F (36.7 C)     Incision:  Proximal incisions are healing; the left below knee incision has serous drainage with a seroma. Extremities:  + doppler signals in the left AT/PT and faint signal in the left DP.     Assessment/Plan:  This is a 82 y.o. male who is s/p: left above knee popliteal to below  knee popliteal bypass with ipsilateral reversed GSV on 02/08/16  -pt with seroma of left below knee incision, which was opened and drained today and packed with nu-gauze.  We will get HHRN for bid wet to dry dressing changes with nu-gauze.   -this does not appear infected and the pt has not had any fevers and therefore, will not prescribe any abx today. -he does have + doppler signals in the left PT/AT and faint DP doppler signals. -PT signed off on him today and he will continue walking and doing his exercises as tolerated and elevating legs when not walking to help with the swelling.   -he will see Dr. Darrick PennaFields back in one month   Doreatha MassedSamantha Zara Wendt, PA-C [facility] [phone]  Clinic MD:  Pt seen and examined with Dr. Darrick PennaFields  History and exam findings as above. Large seroma drained from the below-knee popliteal incision today. There was no evidence of infection. This will be packed with Nu Gauze. The patient will follow-up with me in 1 month. Graft is patent with Doppler signals in the foot today. Upper thigh incisions are essentially healed at this point.  Fabienne Brunsharles Fields, MD [facility] [place] Office: [phone] Pager: [phone]

## 2016-03-03 ENCOUNTER — Telehealth: Payer: Self-pay | Admitting: *Deleted

## 2016-03-03 NOTE — Telephone Encounter (Signed)
[facility].  Home health is to see patient twice daily and pack wound with wet to dry nu gauze.  Rexford MausLucky states that she will send someone out this evening to pack and to educate patient on packing.

## 2016-03-28 DEATH — deceased

## 2016-04-06 ENCOUNTER — Ambulatory Visit: Payer: Medicare Other | Admitting: Vascular Surgery

## 2016-04-21 ENCOUNTER — Encounter (HOSPITAL_COMMUNITY): Payer: Self-pay

## 2016-06-01 ENCOUNTER — Ambulatory Visit: Payer: Medicare Other | Admitting: Vascular Surgery

## 2017-02-20 IMAGING — CT CT ANGIO AOBIFEM WO/W CM
1 of 7 series · 5 of 16 positions shown, 7 images · IV contrast (ISOVUE)
Comparison: Prior CT abdomen and pelvis 11/25/2015

CLINICAL DATA: 89-year-old male with the popliteal and femoral
artery aneurysms. Additionally, history of abdominal aortic aneurysm
status Hijita Lemez 11/01/15.

EXAM:
CT ANGIOGRAPHY OF ABDOMINAL AORTA WITH ILIOFEMORAL RUNOFF
TECHNIQUE: Multidetector CT imaging of the abdomen, pelvis and lower
extremities was performed using the standard protocol during bolus
administration of intravenous contrast. Multiplanar CT image
reconstructions and MIPs were obtained to evaluate the vascular
anatomy.
CONTRAST:  125 mL Isovue 370

[Series 5: arterial · axial · arterial · 0.75mm/px · z∈[-1300,-292]mm · 5 of 756 slices shown, 7 images]
[im 126/756  soft-tissue]
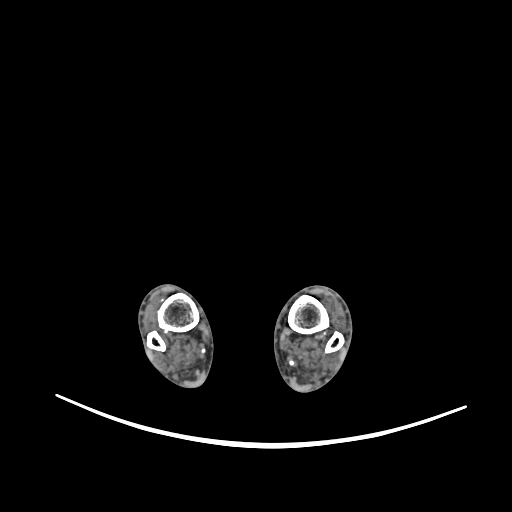
[im 126/756  bone]
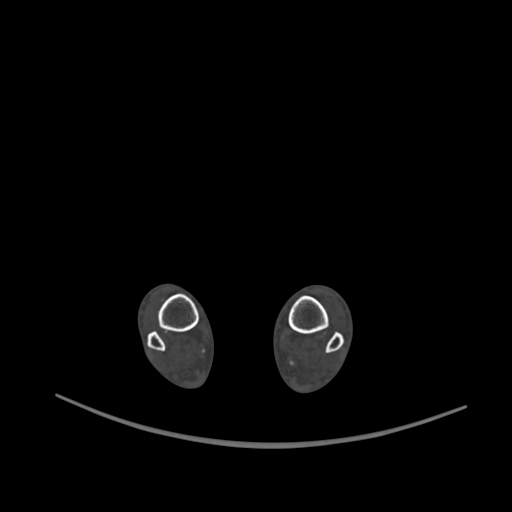
[im 252/756  soft-tissue]
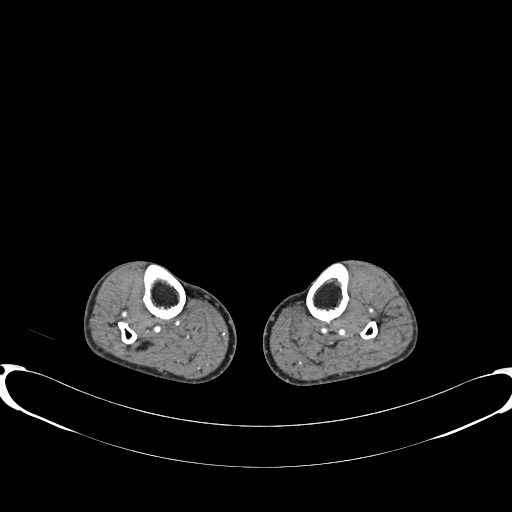
[im 378/756  soft-tissue]
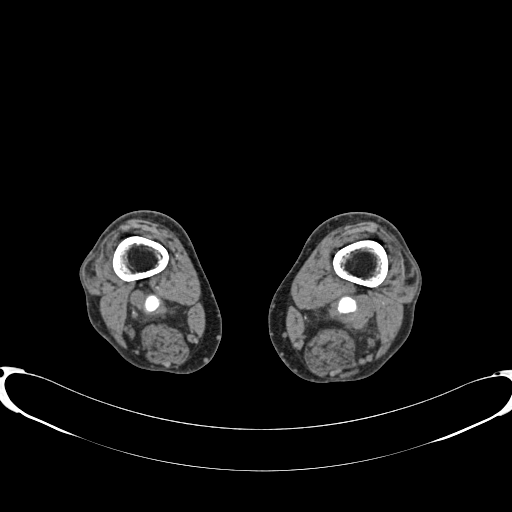
[im 504/756  soft-tissue]
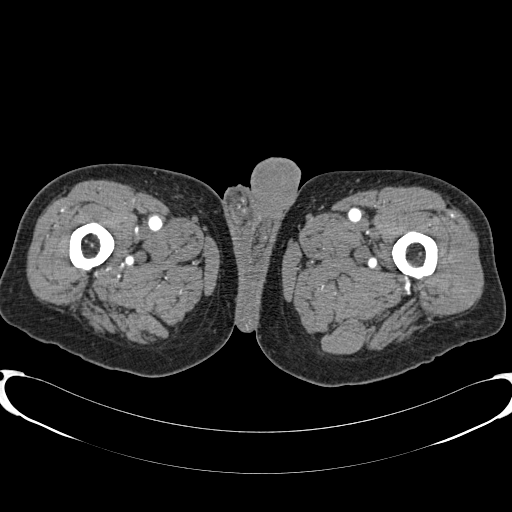
[im 630/756  soft-tissue]
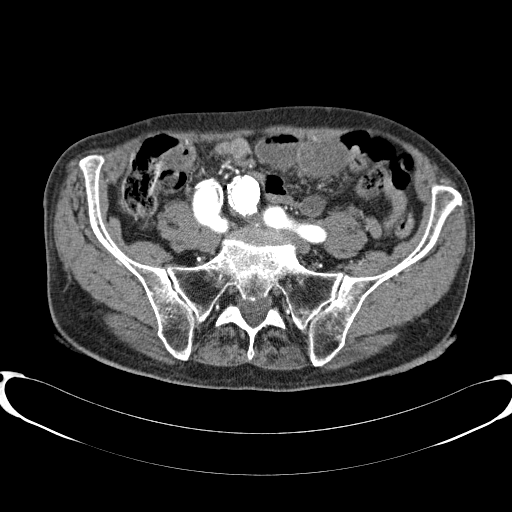
[im 630/756  bone]
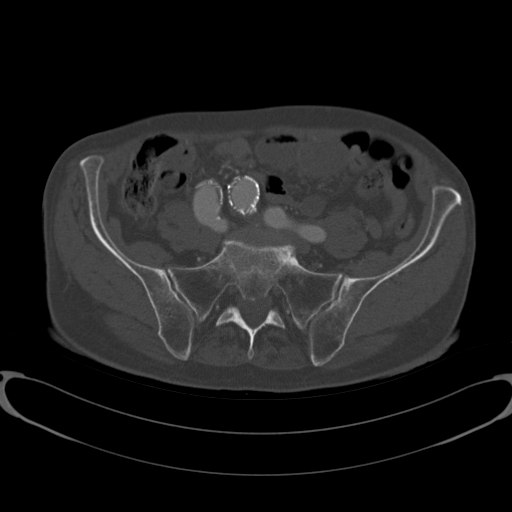

[5 of 16 positions shown; findings below may reference images not displayed]

FINDINGS: VASCULAR

Aorta: Highly tortuous descending and abdominal aorta with
infrarenal aneurysmal dilatation. Surgical changes of interval
endovascular aortic repair with a bifurcated core endo graft which
extends from just below the renal arteries into the bilateral common
iliac arteries. The excluded aneurysm sac remains stable at 6.0 x
5.9 cm. No evidence of endoleak on the arterial phase images. No
delayed phase images were obtained.

Celiac: Tortuous. Mild ectasia proximal to the trifurcation with a
maximal diameter of 1.2 cm. Stable small 1 cm splenic artery
aneurysm near the hila. Conventional hepatic arterial anatomy.

SMA: Fibro fatty plaque at the origin results in moderate focal
stenosis. The distal branch vessels are patent.

Renals: Ectasia bordering on mild aneurysmal dilatation of the
origin of the left renal artery. The vessel measures 1.4 cm at this
location compared to 9 mm in the adjacent normal segment. The right
renal artery is within normal limits although arises more anteriorly
than typical from the aorta secondary to aortic tortuosity.

IMA: The IMA appears to be occluded in the proximal segment. The
distal branches reconstitutes via collateral flow.

RIGHT Lower Extremity

Inflow: Stable aneurysmal dilatation of the right common iliac
artery at 2.4 cm. Focal aneurysmal dilatation of the proximal
internal iliac artery stable at 2.3 cm. The external iliac artery is
relatively disease free.

Outflow: Surgical changes of femoral cutdown appear well healed.
Interval excision of aneurysmal common femoral artery with primary
tube graft repair. No evidence of arterial injury common dissection
or aneurysm. The profunda femoral and superficial femoral arteries
are widely patent. Fusiform aneurysmal dilatation present in the
above the knee popliteal artery measuring 2.3 x 2.1 cm. There is
smooth circumferential wall adherent mural thrombus along the
posterior and posteromedial walls of the aneurysm.

Runoff: The anterior tibial artery occludes in the mid calf. The
posterior tibial and peroneal arteries remain patent to the foot.
The dorsalis pedis artery reconstitutes just above the ankle.

LEFT lower Extremity

Inflow: Stable aneurysmal dilatation of the common iliac artery to
2.4 cm at the distal aspect of the contralateral limb. More mild
aneurysmal dilatation is present just proximal to the bifurcation
were the vessel measures 2 cm. Aneurysmal dilatation of the left
internal iliac artery remains stable at 2.4 cm. The external iliac
artery is relatively disease free.

Outflow: Ectasia of the common femoral artery with a maximal
dimensions of 2.3 x 2.1 cm. The profunda femoral and superficial
femoral arteries are widely patent. The aneurysmal dilatation
present of the above the knee popliteal artery measuring up to 3.2 x
2.9 cm. There is fairly extensive peripheral wall adherent mural
thrombus along the posterior and posterolateral walls. The popliteal
artery is mildly diseased at the level of the knee joint and also
mildly aneurysmal with a maximal diameter of 2.0 x 1.8 cm.

Runoff: The anterior tibial artery occludes in the proximal calf.
The posterior tibial and peroneal arteries remain patent to the
ankle. No definite reconstitution of the dorsalis pedis artery.

Veins: No focal venous abnormality.

Review of the MIP images confirms the above findings.

NON-VASCULAR

Lower Chest: Mild dependent atelectasis in lung bases. Borderline
cardiomegaly. No pericardial effusion. Unremarkable distal thoracic
esophagus.

Abdomen: Unremarkable CT appearance of the stomach, duodenum,
spleen, adrenal glands and pancreas. Normal hepatic contour and
morphology. No discrete hepatic lesion. Gallbladder is unremarkable.
No intra or extrahepatic biliary ductal dilatation.

Unremarkable appearance of the bilateral kidneys. No focal solid
lesion, hydronephrosis or nephrolithiasis.

No evidence of bowel obstruction or focal bowel wall thickening.
Redundant sigmoid colon with large retained stool burden concerning
for constipation.

Pelvis: Unremarkable bladder, prostate gland and seminal vesicles.
No free fluid or suspicious adenopathy.

Bones/Soft Tissues: No acute fracture or aggressive appearing lytic
or blastic osseous lesion. Multilevel degenerative disc disease. No
spondylolisthesis or listhesis. Mild lower lumbar facet arthropathy.
IMPRESSION: VASCULAR

1. Interval endovascular aortic repair of a fusiform infrarenal
abdominal aortic aneurysm. The aneurysm sac remains unchanged at 6 x
5.9 cm and there is no evidence of endoleak on this arterial phase
study. Please note the delayed phase imaging was not obtained.
2. Interval surgical changes of right common femoral artery
aneurysmectomy and interposition graft repair without evidence of
complication.
3. Fusiform aneurysmal dilatation of the above the knee right
popliteal artery with a maximal diameter of 2.3 x 2.1 cm. Smooth
eccentric wall adherent mural thrombus.
4. Bilobed left popliteal artery aneurysm beginning in the above the
knee segment with a maximal diameter of 3.2 x 2.9 cm and a second
aneurysmal component at the level of the knee joint measuring 2.1 x
1.8 cm. Smooth eccentric wall adherent thrombus.
5. Stable left common femoral artery aneurysm at 2.3 x 2.1 cm.
6. Stable bilateral common iliac and internal iliac artery aneurysms
as described above.
7. Small stable splenic artery aneurysm near the hilum.
8. Moderate focal stenosis of the superior mesenteric artery origin
secondary to fibro fatty plaque.
9. Two vessel runoff to the ankles bilaterally. Both anterior tibial
arteries occlude in the proximal calves.
CT ABD/PELVIS

1. Moderate volume of retained stool in the sigmoid colon and rectum
as can be seen in the setting of constipation.
2. Otherwise, ancillary findings as above without significant
interval change.
